# Patient Record
Sex: Female | Born: 1935 | Race: Black or African American | Hispanic: No | Smoking: Never smoker
Health system: Southern US, Community
[De-identification: ages and names within clinical notes are randomized; demographics above are authoritative.]

## PROBLEM LIST (undated history)

## (undated) DIAGNOSIS — I2510 Atherosclerotic heart disease of native coronary artery without angina pectoris: Secondary | ICD-10-CM

## (undated) DIAGNOSIS — I10 Essential (primary) hypertension: Secondary | ICD-10-CM

## (undated) DIAGNOSIS — F0390 Unspecified dementia without behavioral disturbance: Secondary | ICD-10-CM

## (undated) DIAGNOSIS — M069 Rheumatoid arthritis, unspecified: Secondary | ICD-10-CM

## (undated) HISTORY — PX: OTHER SURGICAL HISTORY: SHX169

---

## 1998-11-22 ENCOUNTER — Encounter: Payer: Self-pay | Admitting: Emergency Medicine

## 1998-11-22 ENCOUNTER — Emergency Department (HOSPITAL_COMMUNITY): Admission: EM | Admit: 1998-11-22 | Discharge: 1998-11-22 | Payer: Self-pay | Admitting: Emergency Medicine

## 2001-09-05 ENCOUNTER — Other Ambulatory Visit: Admission: RE | Admit: 2001-09-05 | Discharge: 2001-09-05 | Payer: Self-pay | Admitting: Obstetrics and Gynecology

## 2014-01-29 ENCOUNTER — Encounter (HOSPITAL_COMMUNITY): Payer: Self-pay | Admitting: Emergency Medicine

## 2014-01-29 ENCOUNTER — Emergency Department (HOSPITAL_COMMUNITY)
Admission: EM | Admit: 2014-01-29 | Discharge: 2014-01-29 | Disposition: A | Discharge status: Home / Self Care | Payer: Medicare Other | Attending: Emergency Medicine | Admitting: Emergency Medicine

## 2014-01-29 DIAGNOSIS — I10 Essential (primary) hypertension: Secondary | ICD-10-CM | POA: Insufficient documentation

## 2014-01-29 DIAGNOSIS — X500XXA Overexertion from strenuous movement or load, initial encounter: Secondary | ICD-10-CM | POA: Insufficient documentation

## 2014-01-29 DIAGNOSIS — Y9389 Activity, other specified: Secondary | ICD-10-CM | POA: Insufficient documentation

## 2014-01-29 DIAGNOSIS — S46819A Strain of other muscles, fascia and tendons at shoulder and upper arm level, unspecified arm, initial encounter: Secondary | ICD-10-CM

## 2014-01-29 DIAGNOSIS — S43499A Other sprain of unspecified shoulder joint, initial encounter: Secondary | ICD-10-CM | POA: Insufficient documentation

## 2014-01-29 DIAGNOSIS — Y929 Unspecified place or not applicable: Secondary | ICD-10-CM | POA: Insufficient documentation

## 2014-01-29 DIAGNOSIS — I2510 Atherosclerotic heart disease of native coronary artery without angina pectoris: Secondary | ICD-10-CM | POA: Insufficient documentation

## 2014-01-29 HISTORY — DX: Essential (primary) hypertension: I10

## 2014-01-29 HISTORY — DX: Atherosclerotic heart disease of native coronary artery without angina pectoris: I25.10

## 2014-01-29 MED ORDER — IBUPROFEN 400 MG PO TABS
400.0000 mg | ORAL_TABLET | Freq: Once | ORAL | Status: AC
  Administered 2014-01-29: 400 mg via ORAL
  Filled 2014-01-29: qty 1

## 2014-01-29 MED ORDER — IBUPROFEN 400 MG PO TABS: 400.0000 mg | ORAL_TABLET | Freq: Four times a day (QID) | ORAL | Status: DC | PRN

## 2014-01-29 NOTE — ED Notes (Signed)
C/o right neck pain which goes to right shoulder. "If feels like worms in my shoulder....something keeps moving". No deformity noted.
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
## 2014-01-29 NOTE — ED Provider Notes (Signed)
Medical screening examination/treatment/procedure(s) were performed by non-physician practitioner and as supervising physician I was immediately available for consultation/collaboration.     Adamae Ricklefs, MD 01/29/14 1533 

## 2014-01-29 NOTE — Progress Notes (Addendum)
01/29/2014 403 W. Aundria Rud RN BSN [phone] ED f/u Appt scheduled for 2/10 at 215p.  Notified patient of appt date and time. Pt verbalized understanding teach back method used . No further ED CM needs identified    2/2/ 2015 2:58 W. Aundria Rud RN BSN [phone] [facility] ED CM consulted to meet with patient regarding medication issue. Pt presented to [facility] ED with c/o right side neck pain.  CM in to speak with patient, Patient states, that she has not take her b/p medications in about 2 months, because it made her feel, like "something was crawling on her",  Asked patient about PCP who prescribes her medications. She reports that she goes to the [facility] and see Dayton Scrape NP. Offered to contact [facility] to schedule ED  f/u appt. Pt appreciative and agreeable with plan. Discussed the importance of utilizing the PCP, for all non-emergent medical needs. Reviewed and reinforced  emergency medical needs. Pt verbalized understanding teach back method used. Discussed with TNeva Seat PA-C in FT agrees with plan. [facility] ED CM will follow up with patient with schedule appt.

## 2014-01-29 NOTE — ED Notes (Addendum)
States  Rt sided neck pain states had a little boil has no idea where that went  Feel like something is ,moving in neck she states no injury

## 2014-01-29 NOTE — Discharge Instructions (Signed)

## 2014-08-07 ENCOUNTER — Ambulatory Visit: Payer: Medicare Other | Admitting: Cardiovascular Disease

## 2014-09-17 ENCOUNTER — Emergency Department (HOSPITAL_COMMUNITY): Payer: Medicare Other

## 2014-09-17 ENCOUNTER — Emergency Department (HOSPITAL_COMMUNITY)
Admission: EM | Admit: 2014-09-17 | Discharge: 2014-09-17 | Disposition: A | Discharge status: Home / Self Care | Payer: Medicare Other | Attending: Emergency Medicine | Admitting: Emergency Medicine

## 2014-09-17 ENCOUNTER — Encounter (HOSPITAL_COMMUNITY): Payer: Self-pay | Admitting: Emergency Medicine

## 2014-09-17 DIAGNOSIS — M79609 Pain in unspecified limb: Secondary | ICD-10-CM | POA: Diagnosis present

## 2014-09-17 DIAGNOSIS — I2510 Atherosclerotic heart disease of native coronary artery without angina pectoris: Secondary | ICD-10-CM | POA: Diagnosis not present

## 2014-09-17 DIAGNOSIS — I10 Essential (primary) hypertension: Secondary | ICD-10-CM | POA: Insufficient documentation

## 2014-09-17 DIAGNOSIS — M79641 Pain in right hand: Secondary | ICD-10-CM
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
## 2014-09-17 NOTE — Discharge Instructions (Signed)

## 2014-09-17 NOTE — ED Notes (Signed)
PA speaking with pt and updated on POC
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
## 2014-09-17 NOTE — Progress Notes (Signed)
Orthopedic Tech Progress Note Patient Details:  Emma [name] Apr 04, 1932 [id number] [employer] Type of Ortho Device: Velcro wrist splint Ortho Device/Splint Location: RUE Ortho Device/Splint Interventions: Application   [name] 09/17/2014, 2:05 PM

## 2014-09-18 NOTE — ED Provider Notes (Signed)
Medical screening examination/treatment/procedure(s) were performed by non-physician practitioner and as supervising physician I was immediately available for consultation/collaboration.     Suzi Roots, MD 09/18/14 1309

## 2014-10-17 ENCOUNTER — Ambulatory Visit: Payer: Medicare Other | Admitting: Cardiovascular Disease

## 2015-05-15 ENCOUNTER — Encounter (HOSPITAL_COMMUNITY): Payer: Self-pay | Admitting: Emergency Medicine

## 2015-05-15 ENCOUNTER — Emergency Department (INDEPENDENT_AMBULATORY_CARE_PROVIDER_SITE_OTHER)
Admission: EM | Admit: 2015-05-15 | Discharge: 2015-05-15 | Disposition: A | Discharge status: Home / Self Care | Payer: Medicare Other | Source: Home / Self Care | Attending: Family Medicine | Admitting: Family Medicine

## 2015-05-15 DIAGNOSIS — I10 Essential (primary) hypertension: Secondary | ICD-10-CM | POA: Diagnosis not present

## 2015-05-15 MED ORDER — LISINOPRIL-HYDROCHLOROTHIAZIDE 10-12.5 MG PO TABS: 1.0000 | ORAL_TABLET | Freq: Every day | ORAL | Status: DC

## 2015-05-15 NOTE — Discharge Instructions (Signed)

## 2015-05-15 NOTE — ED Provider Notes (Signed)
CSN: [id number]     Arrival date & time 05/15/15  1846 History   First MD Initiated Contact with Patient 05/15/15 1938     Chief Complaint  Patient presents with  . Hypertension   (Consider location/radiation/quality/duration/timing/severity/associated sxs/prior Treatment) HPI Comments: 81 year old female is brought in by the daughter with uncontrolled hypertension. Associated symptoms or current complaints. The patient states that she has her medicines at home but she just declines to take them. Her daughter states that if I will give her prescription for some actually that she takes it every day. There is probably some suspicion that the patient may have some dementia or forgetfulness.   Past Medical History  Diagnosis Date  . Hypertension   . Coronary artery disease    Past Surgical History  Procedure Laterality Date  . Stents     History reviewed. No pertinent family history. History  Substance Use Topics  . Smoking status: Never Smoker   . Smokeless tobacco: Not on file  . Alcohol Use: No   OB History    No data available     Review of Systems  Constitutional: Negative for fever, activity change and fatigue.  HENT: Negative.   Respiratory: Negative.  Negative for cough and shortness of breath.   Cardiovascular: Negative for chest pain, palpitations and leg swelling.  Gastrointestinal: Negative.   Neurological: Negative.   All other systems reviewed and are negative.   Allergies  Review of patient's allergies indicates no known allergies.  Home Medications   Prior to Admission medications   Medication Sig Start Date End Date Taking? Authorizing Provider  lisinopril (PRINIVIL,ZESTRIL) 10 MG tablet Take 10 mg by mouth daily.   Yes Historical Provider, MD  ibuprofen (ADVIL,MOTRIN) 400 MG tablet Take 1 tablet (400 mg total) by mouth every 6 (six) hours as needed. 01/29/14   Tiffany Neva Seat, PA-C  lisinopril-hydrochlorothiazide (PRINZIDE,ZESTORETIC) 10-12.5 MG per tablet  Take 1 tablet by mouth daily. 05/15/15   Hayden Rasmussen, NP   BP 199/111 mmHg  Pulse 70  Temp(Src) 98.9 F (37.2 C) (Oral)  Resp 18  SpO2 100% Physical Exam  Constitutional: She appears well-developed. No distress.  Eyes: EOM are normal.  Neck: Normal range of motion. Neck supple. No JVD present.  No carotid bruits. Bi Lateral carotid pulses 2+.  Cardiovascular: Normal rate, regular rhythm and normal heart sounds.   Pulmonary/Chest: Effort normal and breath sounds normal. No respiratory distress. She has no wheezes. She has no rales.  Abdominal: Soft. There is no tenderness.  Musculoskeletal: She exhibits no edema.  Lymphadenopathy:    She has no cervical adenopathy.  Neurological: She is alert. No cranial nerve deficit. She exhibits normal muscle tone.  Skin: Skin is warm and dry.  Nursing note and vitals reviewed.   ED Course  Procedures (including critical care time) Labs Review Labs Reviewed - No data to display  Imaging Review No results found.   MDM   1. Essential hypertension    Exam unremarkable. Blood pressure noted. Will write prescription for Zestoretic 10/12.5 mg to take one daily and given to the daughter so that she may administer to her mother. She is advised to take a blood pressure about 3 times a week now and follow-up with her doctor next week for blood pressure check and possible additional medication management.    Hayden Rasmussen, NP 05/15/15 2001

## 2015-05-15 NOTE — ED Notes (Signed)
Pt has high blood pressure pt has BP medication but chooses not to take it

## 2015-06-13 ENCOUNTER — Ambulatory Visit: Payer: Medicare Other | Admitting: Cardiology

## 2017-09-16 ENCOUNTER — Emergency Department (HOSPITAL_COMMUNITY)
Admission: EM | Admit: 2017-09-16 | Discharge: 2017-09-16 | Disposition: A | Discharge status: Home / Self Care | Payer: Medicare Other | Attending: Emergency Medicine | Admitting: Emergency Medicine

## 2017-09-16 ENCOUNTER — Encounter (HOSPITAL_COMMUNITY): Payer: Self-pay | Admitting: Emergency Medicine

## 2017-09-16 DIAGNOSIS — Z79899 Other long term (current) drug therapy: Secondary | ICD-10-CM | POA: Insufficient documentation

## 2017-09-16 DIAGNOSIS — I10 Essential (primary) hypertension: Secondary | ICD-10-CM | POA: Diagnosis not present

## 2017-09-16 DIAGNOSIS — M79604 Pain in right leg: Secondary | ICD-10-CM | POA: Diagnosis present

## 2017-09-16 DIAGNOSIS — M79605 Pain in left leg: Secondary | ICD-10-CM | POA: Diagnosis not present

## 2017-09-16 DIAGNOSIS — I2510 Atherosclerotic heart disease of native coronary artery without angina pectoris: Secondary | ICD-10-CM | POA: Diagnosis not present

## 2017-09-16 DIAGNOSIS — M791 Myalgia: Secondary | ICD-10-CM | POA: Diagnosis not present

## 2017-09-16 HISTORY — DX: Rheumatoid arthritis, unspecified: M06.9

## 2017-09-16 LAB — I-STAT CHEM 8, ED
BUN: 11 mg/dL (ref 6–20)
CALCIUM ION: 1.2 mmol/L (ref 1.15–1.40)
CHLORIDE: 105 mmol/L (ref 101–111)
Creatinine, Ser: 0.5 mg/dL (ref 0.44–1.00)
GLUCOSE: 114 mg/dL — AB (ref 65–99)
HCT: 39 % (ref 36.0–46.0)
HEMOGLOBIN: 13.3 g/dL (ref 12.0–15.0)
POTASSIUM: 3.9 mmol/L (ref 3.5–5.1)
SODIUM: 142 mmol/L (ref 135–145)
TCO2: 27 mmol/L (ref 22–32)

## 2017-09-16 MED ORDER — TRAMADOL HCL 50 MG PO TABS: 50.0000 mg | ORAL_TABLET | Freq: Four times a day (QID) | ORAL | 0 refills | Status: DC | PRN

## 2017-09-16 MED ORDER — ACETAMINOPHEN 500 MG PO TABS
1000.0000 mg | ORAL_TABLET | Freq: Once | ORAL | Status: AC
  Administered 2017-09-16: 1000 mg via ORAL
  Filled 2017-09-16: qty 2
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
## 2017-09-16 NOTE — ED Triage Notes (Signed)
Pt. reports chronic bilateral feet,knees and lower legs burning" sensation/feels "hot" with tingling for several weeks , denies injury/ambulatory .

## 2017-10-04 ENCOUNTER — Emergency Department (HOSPITAL_COMMUNITY)
Admission: EM | Admit: 2017-10-04 | Discharge: 2017-10-05 | Disposition: A | Discharge status: Home / Self Care | Payer: Medicare Other | Attending: Emergency Medicine | Admitting: Emergency Medicine

## 2017-10-04 ENCOUNTER — Encounter (HOSPITAL_COMMUNITY): Payer: Self-pay

## 2017-10-04 DIAGNOSIS — X58XXXA Exposure to other specified factors, initial encounter: Secondary | ICD-10-CM | POA: Insufficient documentation

## 2017-10-04 DIAGNOSIS — S29012A Strain of muscle and tendon of back wall of thorax, initial encounter: Secondary | ICD-10-CM | POA: Diagnosis not present

## 2017-10-04 DIAGNOSIS — Y999 Unspecified external cause status: Secondary | ICD-10-CM | POA: Insufficient documentation

## 2017-10-04 DIAGNOSIS — Y939 Activity, unspecified: Secondary | ICD-10-CM | POA: Diagnosis not present

## 2017-10-04 DIAGNOSIS — M79605 Pain in left leg: Secondary | ICD-10-CM | POA: Diagnosis present

## 2017-10-04 DIAGNOSIS — I10 Essential (primary) hypertension: Secondary | ICD-10-CM | POA: Insufficient documentation

## 2017-10-04 DIAGNOSIS — I2510 Atherosclerotic heart disease of native coronary artery without angina pectoris: Secondary | ICD-10-CM | POA: Diagnosis not present

## 2017-10-04 DIAGNOSIS — T148XXA Other injury of unspecified body region, initial encounter: Secondary | ICD-10-CM

## 2017-10-04 DIAGNOSIS — Y929 Unspecified place or not applicable: Secondary | ICD-10-CM | POA: Diagnosis not present

## 2017-10-04 DIAGNOSIS — R202 Paresthesia of skin: Secondary | ICD-10-CM

## 2017-10-04 DIAGNOSIS — Z79899 Other long term (current) drug therapy: Secondary | ICD-10-CM | POA: Insufficient documentation

## 2017-10-04 LAB — CBC
HCT: 37.6 % (ref 36.0–46.0)
Hemoglobin: 12.8 g/dL (ref 12.0–15.0)
MCH: 31.2 pg (ref 26.0–34.0)
MCHC: 34 g/dL (ref 30.0–36.0)
MCV: 91.7 fL (ref 78.0–100.0)
PLATELETS: 177 10*3/uL (ref 150–400)
RBC: 4.1 MIL/uL (ref 3.87–5.11)
RDW: 12.8 % (ref 11.5–15.5)
WBC: 6.2 10*3/uL (ref 4.0–10.5)

## 2017-10-04 LAB — BASIC METABOLIC PANEL
Anion gap: 8 (ref 5–15)
BUN: 8 mg/dL (ref 6–20)
CHLORIDE: 107 mmol/L (ref 101–111)
CO2: 24 mmol/L (ref 22–32)
CREATININE: 0.7 mg/dL (ref 0.44–1.00)
Calcium: 9.4 mg/dL (ref 8.9–10.3)
GFR calc Af Amer: >60 mL/min (ref >60)
GFR calc non Af Amer: >60 mL/min (ref >60)
Glucose, Bld: 111 mg/dL — ABNORMAL HIGH (ref 65–99)
Potassium: 3.6 mmol/L (ref 3.5–5.1)
Sodium: 139 mmol/L (ref 135–145)

## 2017-10-04 LAB — I-STAT TROPONIN, ED: Troponin i, poc: 0.01 ng/mL (ref 0.00–0.08)

## 2017-10-04 MED ORDER — ACETAMINOPHEN 500 MG PO TABS
1000.0000 mg | ORAL_TABLET | Freq: Once | ORAL | Status: AC
  Administered 2017-10-04: 1000 mg via ORAL
  Filled 2017-10-04: qty 2
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
## 2017-10-04 NOTE — ED Provider Notes (Signed)
[facility]-EMERGENCY DEPT Provider Note   CSN: [id number] Arrival date & time: 10/04/17  2019     History   Chief Complaint Chief Complaint  Patient presents with  . Shooting pain from legs to head    HPI Emma [name] is a 83 y.o. female.  HPI   83 year old female with a history of hypertension, CAD, rheumatoid arthritis who presents to the emergency department with "electrical shocks that, from my legs up through my buttocks, up my back and into my head." She reports that this pain ongoing for several months. Exacerbated with movement at times. At times it sporadic in nature. Last several seconds. Some resolving. She reports that she sits around at home quite a bit. Is able to get up and ambulate. Denies any lower extremity weakness, bladder or bowel incontinence. Recent fevers or infections. In triage she endorsed left-sided chest pain has been ongoing for 4 days however to me she denied any chest pain and states that the pain that she is feeling is this electrical shock to her back. No associated shortness of breath, nausea, vomiting, abdominal pain. No other alleviating or aggravating factors. No other physical complaints.  Past Medical History:  Diagnosis Date  . Coronary artery disease   . Hypertension   . RA (rheumatoid arthritis) (HCC)     There are no active problems to display for this patient.   Past Surgical History:  Procedure Laterality Date  . stents      OB History    No data available       Home Medications    Prior to Admission medications   Medication Sig Start Date End Date Taking? Authorizing Provider  cholecalciferol (VITAMIN D) 400 units TABS tablet Take 400 Units by mouth daily after lunch.   Yes [provider]  HONEY PO Take 5 mLs by mouth daily. TAKE WITH PEANUT BUTTER ALSO To help control blood pressure ( OF EACH)   Yes [provider]  ibuprofen (ADVIL,MOTRIN) 400 MG tablet Take 1 tablet (400 mg total) by mouth every 6 (six)  hours as needed. 01/29/14  Yes Neva Seat, Tiffany, PA-C  OVER THE COUNTER MEDICATION Take 2 capsules by mouth every morning. OVER THE COUNTER VITAMIN THAT HAS OYSTER/ CRAB/ LOBSTER IN IT   Yes [provider]  traMADol (ULTRAM) 50 MG tablet Take 1 tablet (50 mg total) by mouth every 6 (six) hours as needed. 09/16/17  Yes Rolan Bucco, MD  TRAVATAN Z 0.004 % SOLN ophthalmic solution Place 1 drop into both eyes at bedtime. 08/12/17  Yes [provider]  vitamin A 82956 UNIT capsule Take 10,000 Units by mouth every morning.   Yes [provider]  vitamin B-12 (CYANOCOBALAMIN) 100 MCG tablet Take 100 mcg by mouth every morning.   Yes [provider]  vitamin C (ASCORBIC ACID) 500 MG tablet Take 500 mg by mouth daily after lunch.   Yes [provider]  vitamin E 400 UNIT capsule Take 400 Units by mouth daily after lunch.   Yes [provider]  Apoaequorin (PREVAGEN PO) Take 1 capsule by mouth daily after lunch.    [provider]  lisinopril-hydrochlorothiazide (PRINZIDE,ZESTORETIC) 10-12.5 MG per tablet Take 1 tablet by mouth daily. Patient not taking: Reported on 10/04/2017 05/15/15   Hayden Rasmussen, NP    Family History No family history on file.  Social History Social History  Substance Use Topics  . Smoking status: Never Smoker  . Smokeless tobacco: Never Used  . Alcohol use  No     Allergies   Patient has no known allergies.   Review of Systems Review of Systems All other systems are reviewed and are negative for acute change except as noted in the HPI   Physical Exam Updated Vital Signs BP (!) 174/74   Pulse 65   Temp 98.7 F (37.1 C) (Oral)   Resp 12   Ht 5\' 10"  (1.778 m)   Wt 79.4 kg (175 lb)   SpO2 98%   BMI 25.11 kg/m   Physical Exam  Constitutional: She is oriented to person, place, and time. She appears well-developed and well-nourished. No distress.  HENT:  Head: Normocephalic and atraumatic.  Nose: Nose  normal.  Eyes: Pupils are equal, round, and reactive to light. Conjunctivae and EOM are normal. Right eye exhibits no discharge. Left eye exhibits no discharge. No scleral icterus.  Neck: Normal range of motion. Neck supple.  Cardiovascular: Normal rate and regular rhythm.  Exam reveals no gallop and no friction rub.   No murmur heard. Pulmonary/Chest: Effort normal and breath sounds normal. No stridor. No respiratory distress. She has no rales.  Abdominal: Soft. She exhibits no distension. There is no tenderness.  Musculoskeletal: She exhibits no edema.       Cervical back: She exhibits tenderness. She exhibits no bony tenderness.       Thoracic back: She exhibits tenderness. She exhibits no bony tenderness.       Back:  Neurological: She is alert and oriented to person, place, and time.  Spine Exam: Strength: 5/5 throughout LE bilaterally (hip flexion/extension, adduction/abduction; knee flexion/extension; foot dorsiflexion/plantarflexion, inversion/eversion; great toe inversion) Sensation: Intact to light touch in proximal and distal LE bilaterally Reflexes: 1+ quadriceps and achilles reflexes  Skin: Skin is warm and dry. No rash noted. She is not diaphoretic. No erythema.  Psychiatric: She has a normal mood and affect.  Vitals reviewed.    ED Treatments / Results  Labs (all labs ordered are listed, but only abnormal results are displayed) Labs Reviewed  BASIC METABOLIC PANEL - Abnormal; Notable for the following:       Result Value   Glucose, Bld 111 (*)    All other components within normal limits  CBC  I-STAT TROPONIN, ED    EKG  EKG Interpretation  Date/Time:  Monday October 04 2017 20:27:11 EDT Ventricular Rate:  70 PR Interval:    QRS Duration: 93 QT Interval:  419 QTC Calculation: 453 R Axis:   -22 Text Interpretation:  Sinus rhythm Borderline left axis deviation no stemi No old tracing to compare Confirmed by [id number] [phone]) on 10/04/2017 9:09:10 PM         Radiology No results found.  Procedures Procedures (including critical care time)  Medications Ordered in ED Medications  acetaminophen (TYLENOL) tablet 1,000 mg (1,000 mg Oral Given 10/04/17 2121)     Initial Impression / Assessment and Plan / ED Course  I have reviewed the triage vital signs and the nursing notes.  Pertinent labs & imaging results that were available during my care of the patient were reviewed by me and considered in my medical decision making (see chart for details).     EKG nonischemic and without evidence of pericarditis. Cardiac workup was initiated in triage and revealed negative troponin. Do not feel that additional cardiac workup is necessary at this time.  On exam patient has muscular tenderness of her upper mid back. Bending the patient exacerbates her symptomatology. Feel this is likely muscular in  nature. Red flags concerning for cauda equina syndrome. Low suspicion for GBS. No significant electrolyte derangements. Patient given Tylenol which provided significant improvement in her symptomatology.  The patient appears reasonably screened and/or stabilized for discharge and I doubt any other medical condition or other [facility] requiring further screening, evaluation, or treatment in the ED at this time prior to discharge.  The patient is safe for discharge with strict return precautions.   Final Clinical Impressions(s) / ED Diagnoses   Final diagnoses:  Paresthesia  Muscle strain   Disposition: Discharge  Condition: Good  I have discussed the results, Dx and Tx plan with the patient who expressed understanding and agree(s) with the plan. Discharge instructions discussed at great length. The patient was given strict return precautions who verbalized understanding of the instructions. No further questions at time of discharge.    New Prescriptions   No medications on file    Follow Up: Diamantina Providence, FNP [address] [place] [address] [phone]  Schedule an appointment as soon as possible for a visit  in 3-5 days      Ezelle Surprenant, Amadeo Garnet, MD 10/05/17 [phone]

## 2017-10-04 NOTE — ED Notes (Signed)
ED Provider at bedside. 

## 2017-10-04 NOTE — ED Notes (Signed)
Phlebotomy at bedside.

## 2017-10-14 DIAGNOSIS — I2510 Atherosclerotic heart disease of native coronary artery without angina pectoris: Secondary | ICD-10-CM | POA: Insufficient documentation

## 2017-10-14 DIAGNOSIS — R4182 Altered mental status, unspecified: Secondary | ICD-10-CM | POA: Diagnosis not present

## 2017-10-14 DIAGNOSIS — F22 Delusional disorders: Secondary | ICD-10-CM | POA: Insufficient documentation

## 2017-10-14 DIAGNOSIS — Z79899 Other long term (current) drug therapy: Secondary | ICD-10-CM | POA: Insufficient documentation

## 2017-10-14 DIAGNOSIS — I10 Essential (primary) hypertension: Secondary | ICD-10-CM | POA: Insufficient documentation

## 2017-10-14 DIAGNOSIS — M79606 Pain in leg, unspecified: Secondary | ICD-10-CM | POA: Diagnosis present
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
## 2017-10-15 ENCOUNTER — Encounter (HOSPITAL_COMMUNITY): Payer: Self-pay | Admitting: Registered Nurse

## 2017-10-15 ENCOUNTER — Emergency Department (HOSPITAL_COMMUNITY): Payer: Medicare Other

## 2017-10-15 ENCOUNTER — Emergency Department (HOSPITAL_COMMUNITY)
Admission: EM | Admit: 2017-10-15 | Discharge: 2017-10-18 | Disposition: A | Discharge status: Other Acute Inpatient Hospital | Payer: Medicare Other | Attending: Physician Assistant | Admitting: Physician Assistant

## 2017-10-15 DIAGNOSIS — F22 Delusional disorders: Secondary | ICD-10-CM

## 2017-10-15 LAB — TSH: TSH: 2.473 u[IU]/mL (ref 0.350–4.500)

## 2017-10-15 LAB — URINALYSIS, ROUTINE W REFLEX MICROSCOPIC
BILIRUBIN URINE: NEGATIVE
GLUCOSE, UA: NEGATIVE mg/dL
HGB URINE DIPSTICK: NEGATIVE
Ketones, ur: NEGATIVE mg/dL
Leukocytes, UA: NEGATIVE
Nitrite: NEGATIVE
PH: 5 (ref 5.0–8.0)
Protein, ur: NEGATIVE mg/dL
SPECIFIC GRAVITY, URINE: 1.006 (ref 1.005–1.030)

## 2017-10-15 LAB — RAPID URINE DRUG SCREEN, HOSP PERFORMED
AMPHETAMINES: NOT DETECTED
BARBITURATES: NOT DETECTED
Benzodiazepines: NOT DETECTED
Cocaine: NOT DETECTED
OPIATES: NOT DETECTED
TETRAHYDROCANNABINOL: NOT DETECTED

## 2017-10-15 LAB — I-STAT CHEM 8, ED
BUN: 7 mg/dL (ref 6–20)
CALCIUM ION: 1.12 mmol/L — AB (ref 1.15–1.40)
CHLORIDE: 107 mmol/L (ref 101–111)
CREATININE: 0.5 mg/dL (ref 0.44–1.00)
GLUCOSE: 101 mg/dL — AB (ref 65–99)
HCT: 41 % (ref 36.0–46.0)
HEMOGLOBIN: 13.9 g/dL (ref 12.0–15.0)
POTASSIUM: 3.5 mmol/L (ref 3.5–5.1)
Sodium: 141 mmol/L (ref 135–145)
TCO2: 22 mmol/L (ref 22–32)

## 2017-10-15 LAB — CBC
HCT: 41.3 % (ref 36.0–46.0)
Hemoglobin: 13.9 g/dL (ref 12.0–15.0)
MCH: 31.2 pg (ref 26.0–34.0)
MCHC: 33.7 g/dL (ref 30.0–36.0)
MCV: 92.8 fL (ref 78.0–100.0)
Platelets: 182 10*3/uL (ref 150–400)
RBC: 4.45 MIL/uL (ref 3.87–5.11)
RDW: 12.9 % (ref 11.5–15.5)
WBC: 5.2 10*3/uL (ref 4.0–10.5)

## 2017-10-15 MED ORDER — HYDROCHLOROTHIAZIDE 12.5 MG PO CAPS
12.5000 mg | ORAL_CAPSULE | Freq: Once | ORAL | Status: AC
  Administered 2017-10-15: 12.5 mg via ORAL
  Filled 2017-10-15: qty 1

## 2017-10-15 MED ORDER — TRAMADOL HCL 50 MG PO TABS
50.0000 mg | ORAL_TABLET | Freq: Four times a day (QID) | ORAL | Status: DC | PRN
  Administered 2017-10-17: 50 mg via ORAL
  Filled 2017-10-15: qty 1

## 2017-10-15 MED ORDER — ACETAMINOPHEN 325 MG PO TABS
650.0000 mg | ORAL_TABLET | ORAL | Status: DC | PRN
  Administered 2017-10-15 – 2017-10-17 (×2): 650 mg via ORAL

## 2017-10-15 MED ORDER — ACETAMINOPHEN 325 MG PO TABS
650.0000 mg | ORAL_TABLET | ORAL | Status: DC | PRN
  Filled 2017-10-15 (×2): qty 2

## 2017-10-15 MED ORDER — IBUPROFEN 400 MG PO TABS
400.0000 mg | ORAL_TABLET | Freq: Four times a day (QID) | ORAL | Status: DC | PRN
Start: 2017-10-15 — End: 2017-10-18

## 2017-10-15 MED ORDER — LISINOPRIL 10 MG PO TABS
10.0000 mg | ORAL_TABLET | Freq: Once | ORAL | Status: AC
  Administered 2017-10-15: 10 mg via ORAL
  Filled 2017-10-15: qty 1

## 2017-10-15 MED ORDER — LATANOPROST 0.005 % OP SOLN
1.0000 [drp] | Freq: Every day | OPHTHALMIC | Status: DC
  Administered 2017-10-15 – 2017-10-17 (×3): 1 [drp] via OPHTHALMIC
  Filled 2017-10-15: qty 2.5

## 2017-10-15 MED ORDER — LISINOPRIL-HYDROCHLOROTHIAZIDE 10-12.5 MG PO TABS: 1.0000 | ORAL_TABLET | Freq: Every day | ORAL | Status: DC

## 2017-10-15 NOTE — BH Assessment (Signed)
[facility] Assessment Progress Note  Requested labs completed. Nothing medically concerning noted. No UTI. Pt is recommended for IP treatment, per Assunta Found, NP.   Johny Shock. Ladona Ridgel, MS, NCC, LPCA Counselor

## 2017-10-15 NOTE — ED Notes (Signed)
Pt being moved to psych hold

## 2017-10-15 NOTE — Progress Notes (Signed)
Patient meets criteria for inpatient treatment. CSW faxed referrals to the following inpatient facilities for review:  [place], [place], [place], [address], [address], [place], [place], [place], [place], [facility] [facility]   TTS will continue to seek bed placement.   Baldo Daub MSW, LCSWA CSW Disposition [phone]
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
## 2017-10-15 NOTE — ED Notes (Signed)
Pt eating breakfast now.

## 2017-10-15 NOTE — ED Notes (Signed)
tts requesting UA and TSH, edp made aware. Pt just urinated and will re attempt urine sample.

## 2017-10-15 NOTE — ED Notes (Signed)
This RN received a call from the patient's daughter-in-law and son asking about the plan.  This RN informed them that as far as we knew, she is being observed overnight and re-evaluated in the morning because of her confusion.

## 2017-10-15 NOTE — ED Notes (Signed)
Delay in lab draw,  edp at bedside. 
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
## 2017-10-16 DIAGNOSIS — F22 Delusional disorders: Secondary | ICD-10-CM | POA: Insufficient documentation

## 2017-10-16 DIAGNOSIS — R413 Other amnesia: Secondary | ICD-10-CM

## 2017-10-16 DIAGNOSIS — F419 Anxiety disorder, unspecified: Secondary | ICD-10-CM | POA: Diagnosis not present

## 2017-10-16 DIAGNOSIS — R450 Nervousness: Secondary | ICD-10-CM

## 2017-10-16 LAB — I-STAT TROPONIN, ED: Troponin i, poc: 0 ng/mL (ref 0.00–0.08)

## 2017-10-16 MED ORDER — LISINOPRIL 10 MG PO TABS
10.0000 mg | ORAL_TABLET | Freq: Every day | ORAL | Status: DC
  Administered 2017-10-16 – 2017-10-18 (×3): 10 mg via ORAL
  Filled 2017-10-16 (×3): qty 1

## 2017-10-16 MED ORDER — HYDROCHLOROTHIAZIDE 12.5 MG PO CAPS
12.5000 mg | ORAL_CAPSULE | Freq: Every day | ORAL | Status: DC
  Administered 2017-10-16 – 2017-10-18 (×3): 12.5 mg via ORAL
  Filled 2017-10-16 (×3): qty 1

## 2017-10-16 MED ORDER — LISINOPRIL-HYDROCHLOROTHIAZIDE 10-12.5 MG PO TABS: 1.0000 | ORAL_TABLET | Freq: Every day | ORAL | Status: DC

## 2017-10-16 NOTE — ED Notes (Addendum)
Pt noted to have chicken in [employer] box on bedside table - Pt was attempting to eat the chicken. States was brought in by family member last pm. Encouraged pt to give to nurse so may thrown away. Pt gave to RN. Pt also noted to be wearing hospital gown. Pt's belongings in 1 labeled belongings bag w/her black shoes. Pt allowed RN to remove from - labeled and placed at nurses' desk so may be inventoried. Fall Risk bracelet applied to pt's wrist and Fall Risk magnet placed on doorway.

## 2017-10-16 NOTE — ED Notes (Signed)
EDP notified. 

## 2017-10-16 NOTE — ED Notes (Signed)
Lunch tray delivered.

## 2017-10-16 NOTE — ED Notes (Addendum)
Pt c/o left chest pain radiating down to her L leg.

## 2017-10-16 NOTE — BHH Counselor (Signed)
Reassessment note TTS: Pt was cooperative and calm during reassessment. She denies SI, HI or AVH. She states that she doesn't like her living situation and wants to move because people come into her room and take things from her. Pt did not mention anything bizarre or endorse any delusions at this time. Pt denies any psychiatric history but does endorse some depression due to not liking her living situation.   Collateral from son: Son states that he feels that her assisted living ([facility]) may not have enough supervision for her as she seems to be "mentally declining". He states that his sister brought her to [facility] to be evaluated in 2015 and they mentioned she could be developing dementia. Pt has no history of psychiatric issues noted by son.    [address] [place], LCAS

## 2017-10-16 NOTE — ED Notes (Signed)
Pt on phone at nurses' desk. 

## 2017-10-16 NOTE — ED Notes (Signed)
Son, Chrissie Noa, aware of tx plan - will have Telepsych performed.

## 2017-10-16 NOTE — ED Notes (Signed)
Sitting on side of bed talking w/staff.

## 2017-10-16 NOTE — Consult Note (Signed)
Telepsych Consultation   Reason for Consult: Paranoia   Referring Physician: Derwood Kaplan, MD Location of Patient: [facility] ED Location of Provider: [facility]  Patient Identification: Emma [name] MRN:  [id number] Principal Diagnosis: <principal problem not specified> Diagnosis:   Patient Active Problem List   Diagnosis Date Noted  . Paranoid (HCC) [F22]     Total Time spent with patient: 30 minutes  Subjective:   Emma [name] is a 83 y.o. female patient admitted with paranoid ideation.  HPI: Per the TTS assessment completed on 10/15/2017 by Marcelle Smiling: Emma [name] is an 83 y.o. female who presents to ED voluntarily with delusional thinking. Pt has no documented psychiatric hx, although she has been seen by [facility] for cognitive impairment since 2015. Pt also has no documented psych medications. Pt presents as calm, cooperative and pleasant. Pt believes that a group of women in her apartment "unit" are trying to "sexualize" people, including her. Pt reports that, if they are not allowed to "sexualize" her, they "use electricity and paralyze you". Pt says she receives electric shocks up her legs to her buttocks. Pt also shares that she has wrapped socks together with towels and placed them between her legs to prevent them from coming to her, but they still do. Pt indicates that more reports will be coming in concerning this and that she may be the first one. Pt doesn't appear fearful, but indicates that she does not want to go back to her apartment and is requesting help to "find another apartment to move into". Regarding family, pt reports having a child in [place], a child that works for the [employer], and a daughter (whom she specified as Merl Ancelet) who works at [employer] on the 3rd floor.  On Exam: Patient was seen via tele-psych, chart reviewed with treatment team. Patient in bed, awake, alert and oriented x3, disoriented to situation. Patient  exhibting paranoid ideations and continue to believe people are coming into her apartment and shocking her with electric. Patient denies any SI but said she is only HI towards the people that want to come and hurt her. Patient appears to be mildly confused.  Past Psychiatric History: As in H&P  Risk to Self: Suicidal Ideation: No Suicidal Intent: No Is patient at risk for suicide?: No Suicidal Plan?: No Access to Means: No Intentional Self Injurious Behavior: None Risk to Others: Homicidal Ideation: No Thoughts of Harm to Others: No Current Homicidal Intent: No Current Homicidal Plan: No Access to Homicidal Means: No History of harm to others?: No Assessment of Violence: None Noted Does patient have access to weapons?: No Criminal Charges Pending?: No Does patient have a court date: No Prior Inpatient Therapy: Prior Inpatient Therapy: No Prior Outpatient Therapy: Prior Outpatient Therapy: Yes Prior Therapy Dates: unclear Does patient have an ACCT team?: No Does patient have Intensive In-House Services?  : No Does patient have [facility] services? : No Does patient have P4CC services?: No  Past Medical History:  Past Medical History:  Diagnosis Date  . Coronary artery disease   . Hypertension   . RA (rheumatoid arthritis) (HCC)     Past Surgical History:  Procedure Laterality Date  . stents     Family History: History reviewed. No pertinent family history. Family Psychiatric  History: Unknown  Social History:  History  Alcohol Use No     History  Drug use: Unknown    Social History   Social History  . Marital status: Widowed  Spouse name: N/A  . Number of children: N/A  . Years of education: N/A   Social History Main Topics  . Smoking status: Never Smoker  . Smokeless tobacco: Never Used  . Alcohol use No  . Drug use: Unknown  . Sexual activity: Not Asked   Other Topics Concern  . None   Social History Narrative  . None   Additional Social  History:    Allergies:  No Known Allergies  Labs:  Results for orders placed or performed during the hospital encounter of 10/15/17 (from the past 48 hour(s))  CBC     Status: None   Collection Time: 10/15/17  8:15 AM  Result Value Ref Range   WBC 5.2 4.0 - 10.5 K/uL   RBC 4.45 3.87 - 5.11 MIL/uL   Hemoglobin 13.9 12.0 - 15.0 g/dL   HCT 27.0 35.0 - 09.3 %   MCV 92.8 78.0 - 100.0 fL   MCH 31.2 26.0 - 34.0 pg   MCHC 33.7 30.0 - 36.0 g/dL   RDW 81.8 29.9 - 37.1 %   Platelets 182 150 - 400 K/uL  I-stat chem 8, ed     Status: Abnormal   Collection Time: 10/15/17  8:30 AM  Result Value Ref Range   Sodium 141 135 - 145 mmol/L   Potassium 3.5 3.5 - 5.1 mmol/L   Chloride 107 101 - 111 mmol/L   BUN 7 6 - 20 mg/dL   Creatinine, Ser 6.96 0.44 - 1.00 mg/dL   Glucose, Bld 789 (H) 65 - 99 mg/dL   Calcium, Ion 3.81 (L) 1.15 - 1.40 mmol/L   TCO2 22 22 - 32 mmol/L   Hemoglobin 13.9 12.0 - 15.0 g/dL   HCT 01.7 51.0 - 25.8 %  TSH     Status: None   Collection Time: 10/15/17  2:49 PM  Result Value Ref Range   TSH 2.473 0.350 - 4.500 uIU/mL    Comment: Performed by a 3rd Generation assay with a functional sensitivity of <=0.01 uIU/mL.  Urinalysis, Routine w reflex microscopic     Status: Abnormal   Collection Time: 10/15/17  4:07 PM  Result Value Ref Range   Color, Urine STRAW (A) YELLOW   APPearance CLEAR CLEAR   Specific Gravity, Urine 1.006 1.005 - 1.030   pH 5.0 5.0 - 8.0   Glucose, UA NEGATIVE NEGATIVE mg/dL   Hgb urine dipstick NEGATIVE NEGATIVE   Bilirubin Urine NEGATIVE NEGATIVE   Ketones, ur NEGATIVE NEGATIVE mg/dL   Protein, ur NEGATIVE NEGATIVE mg/dL   Nitrite NEGATIVE NEGATIVE   Leukocytes, UA NEGATIVE NEGATIVE  Rapid urine drug screen (hospital performed)     Status: None   Collection Time: 10/15/17  4:07 PM  Result Value Ref Range   Opiates NONE DETECTED NONE DETECTED   Cocaine NONE DETECTED NONE DETECTED   Benzodiazepines NONE DETECTED NONE DETECTED   Amphetamines  NONE DETECTED NONE DETECTED   Tetrahydrocannabinol NONE DETECTED NONE DETECTED   Barbiturates NONE DETECTED NONE DETECTED    Comment:        DRUG SCREEN FOR MEDICAL PURPOSES ONLY.  IF CONFIRMATION IS NEEDED FOR ANY PURPOSE, NOTIFY LAB WITHIN 5 DAYS.        LOWEST DETECTABLE LIMITS FOR URINE DRUG SCREEN Drug Class       Cutoff (ng/mL) Amphetamine      1000 Barbiturate      200 Benzodiazepine   200 Tricyclics       300 Opiates  300 Cocaine          300 THC              50   I-Stat Troponin, ED (not at [facility])     Status: None   Collection Time: 10/16/17  2:49 AM  Result Value Ref Range   Troponin i, poc 0.00 0.00 - 0.08 ng/mL   Comment 3            Comment: Due to the release kinetics of cTnI, a negative result within the first hours of the onset of symptoms does not rule out myocardial infarction with certainty. If myocardial infarction is still suspected, repeat the test at appropriate intervals.     Medications:  Current Facility-Administered Medications  Medication Dose Route Frequency Provider Last Rate Last Dose  . acetaminophen (TYLENOL) tablet 650 mg  650 mg Oral Q4H PRN Rhunette Croft, Ankit, MD      . acetaminophen (TYLENOL) tablet 650 mg  650 mg Oral Q4H PRN Rolland Porter, MD   650 mg at 10/15/17 2306  . lisinopril (PRINIVIL,ZESTRIL) tablet 10 mg  10 mg Oral Daily Rolan Bucco, MD       And  . hydrochlorothiazide (MICROZIDE) capsule 12.5 mg  12.5 mg Oral Daily Belfi, Melanie, MD      . ibuprofen (ADVIL,MOTRIN) tablet 400 mg  400 mg Oral Q6H PRN Nanavati, Ankit, MD      . latanoprost (XALATAN) 0.005 % ophthalmic solution 1 drop  1 drop Both Eyes QHS Rolland Porter, MD   1 drop at 10/15/17 2307  . traMADol (ULTRAM) tablet 50 mg  50 mg Oral Q6H PRN Derwood Kaplan, MD       Current Outpatient Prescriptions  Medication Sig Dispense Refill  . Apoaequorin (PREVAGEN PO) Take 1 capsule by mouth daily after lunch.    . cholecalciferol (VITAMIN D) 400 units TABS tablet  Take 400 Units by mouth daily after lunch.    . HONEY PO Take 5 mLs by mouth daily. TAKE WITH PEANUT BUTTER ALSO To help control blood pressure ( OF EACH)    . ibuprofen (ADVIL,MOTRIN) 400 MG tablet Take 1 tablet (400 mg total) by mouth every 6 (six) hours as needed. 30 tablet 0  . OVER THE COUNTER MEDICATION Take 2 capsules by mouth every morning. OVER THE COUNTER VITAMIN THAT HAS OYSTER/ CRAB/ LOBSTER IN IT    . traMADol (ULTRAM) 50 MG tablet Take 1 tablet (50 mg total) by mouth every 6 (six) hours as needed. 15 tablet 0  . TRAVATAN Z 0.004 % SOLN ophthalmic solution Place 1 drop into both eyes at bedtime.  3  . vitamin A 86381 UNIT capsule Take 10,000 Units by mouth every morning.    . vitamin B-12 (CYANOCOBALAMIN) 100 MCG tablet Take 100 mcg by mouth every morning.    . vitamin C (ASCORBIC ACID) 500 MG tablet Take 500 mg by mouth daily after lunch.    . vitamin E 400 UNIT capsule Take 400 Units by mouth daily after lunch.    . lisinopril-hydrochlorothiazide (PRINZIDE,ZESTORETIC) 10-12.5 MG per tablet Take 1 tablet by mouth daily. (Patient not taking: Reported on 10/04/2017) 30 tablet 0    Musculoskeletal: UTA via camera  Psychiatric Specialty Exam: Physical Exam  Nursing note and vitals reviewed.   Review of Systems  Psychiatric/Behavioral: Positive for memory loss. Negative for depression, hallucinations, substance abuse and suicidal ideas. The patient is nervous/anxious. The patient does not have insomnia.     Blood pressure (!) 148/73,  pulse (!) 118, temperature 97.8 F (36.6 C), temperature source Oral, resp. rate 20, height 5\' 10"  (1.778 m), weight 79.4 kg (175 lb), SpO2 92 %.Body mass index is 25.11 kg/m.  General Appearance: on hospital scrub  Eye Contact:  Fair  Speech:  Pressured  Volume:  Normal  Mood:  Anxious  Affect:  Appropriate  Thought Process:  Descriptions of Associations: Circumstantial  Orientation:  Full (Time, Place, and Person)  Thought Content:   Paranoid Ideation  Suicidal Thoughts:  No  Homicidal Thoughts:  No  Memory:  Immediate;   Fair Recent;   Fair Remote;   Poor  Judgement:  Fair  Insight:  Fair  Psychomotor Activity:  Normal  Concentration:  Concentration: Fair and Attention Span: Fair  Recall:  Poor  Fund of Knowledge:  Good  Language:  Good  Akathisia:  Negative  Handed:  Right  AIMS (if indicated):     Assets:  Desire for Improvement Financial Resources/Insurance Housing Physical Health Resilience  ADL's:  Intact  Cognition:  WNL  Sleep:       Treatment Plan Summary: Daily contact with patient to assess and evaluate symptoms and progress in treatment, Medication management and Plan to admit to inpatient hospitalization  Disposition: Recommend psychiatric Inpatient admission when medically cleared.  This service was provided via telemedicine using a 2-way, interactive audio and video technology.  Names of all persons participating in this telemedicine service and their role in this encounter. Name: Lakeyshia Tuckerman Role: Patient  Name: Cassi Jenne A. Darrol Jump Role: Provider          Beryle Lathe, NP 10/16/2017 10:21 AM

## 2017-10-16 NOTE — ED Notes (Signed)
Pt on phone at nurses' desk - voiced understanding last phone for the day.

## 2017-10-16 NOTE — ED Notes (Signed)
Telepsych being performed. 

## 2017-10-16 NOTE — ED Notes (Signed)
Pt's son visiting w/pt.

## 2017-10-16 NOTE — Progress Notes (Addendum)
LCSW following for disposition:  Inpatient referral  Followed up with the following facilities:  [name]: message left [facility]: pending, still under review,  1:31 PM did not have record of referral, resent for review. [facility]: Declined due to Dementia Dx primary. [facility]: on wait list  Will continue to seek inpatient placement and referrals. Will discuss with [facility] treatment team for possibly re-assessment with NP.  Deretha Emory, MSW Clinical Social Work: Optician, dispensing Coverage for :  [phone]

## 2017-10-17 NOTE — ED Notes (Signed)
Visitor has left. Pt sitting on bed watching tv.

## 2017-10-17 NOTE — ED Notes (Signed)
Visitors x 2 w/pt.

## 2017-10-17 NOTE — ED Notes (Signed)
Female visitor visiting w/pt.

## 2017-10-17 NOTE — BHH Counselor (Signed)
Per the TTS assessment completed on 10/15/2017 by Marcelle Smiling: Emma [name] an 83 y.o.femalewho presents to ED voluntarily with delusional thinking. Pt has no documented psychiatric hx, although she has been seen by [facility] for cognitive impairment since 2015. Pt also has no documented psych medications. Pt presents as calm, cooperative and pleasant. Pt believes that a group of women in her apartment "unit" are trying to "sexualize" people, including her. Pt reports that, if they are not allowed to "sexualize" her, they "use electricity and paralyze you". Pt says she receives electric shocks up her legs to her buttocks. Pt also shares that she has wrapped socks together with towels and placed them between her legs to prevent them from coming to her, but they still do. Pt indicates that more reports will be coming in concerning this and that she may be the first one. Pt doesn't appear fearful, but indicates that she does not want to go back to her apartment and is requesting help to "find another apartment to move into". Regarding family, pt reports having a child in [place], a child that works for the [employer], and a daughter (whom she specified as Emma [name]) who works at [employer] on the 3rd floor.  Pt was reassessed on 10/17/17.  She was sitting upright, eating breakfast.  Pt reported that she feels OK.  When asked about what brought her to the hospital, Pt indicated that she has pain in her legs.  She also stated that people in her apartment complex bother her.  ''I know people don't believe it, but I get electric shocks."    Recommend continued inpatient.

## 2017-10-17 NOTE — ED Notes (Signed)
Pt ambulatory to nurses' desk attempted to make phone call - left message.

## 2017-10-17 NOTE — BHH Counselor (Signed)
Pt tentatively accepted to [facility]  However, they requested IVC paperwork, and Pt is not under IVC.  [facility] is now requesting HPOA.  They will hold bed open until tomorrow.

## 2017-10-17 NOTE — ED Notes (Signed)
Pt ambulatory to nurses' desk, spoke w/staff, and then returned to room. Pt calm, cooperative.

## 2017-10-17 NOTE — Progress Notes (Addendum)
[name] Emma [name], intake) advises facility can accept pt for treatment. However, pt would either have to be under IVC for admission or have a healthcare power of attorney consent to admission. Advise they will not be able to have pt sign herself in voluntarily as this is safety liability as there is documentation pt has dementia and experiences delusions- would question her ability to understand and consent for treatment.  Left voicemail for Emma [name] [phone] - will inquire as to there being a healthcare power of attorney for pt.  Will follow up with Emma [name].  [name], MSW, LCSW Clinical Social Work 10/17/2017 Coverage for [phone]  14:40-  left voicemail again for Emma [name] at number above and for Emma [name] at [phone].   Em[facility] behavioral [name] (Emma [name]) called to state pt has been accepted to [facility] also. States that if pt has HPOA or legal guardian, that individual would be needed to accompany pt at admission to sign her in. States if pt is not under IVC and is her own POA/guardian and is willing to sign in voluntarily and demonstrates capacity to do so, they would accept pt voluntarily.   Will follow up with [name] and [facility] once hearing back from Emma [name] or [name] re: HPOA status.

## 2017-10-17 NOTE — ED Notes (Signed)
Pt given bath in room by sitter.

## 2017-10-17 NOTE — BHH Counselor (Signed)
Attempted to reach daughter -- left voicemail for her at [phone] re: healthcare power of attorney.  [facility] has tentatively accepted Pt to their facility pending IVC or HPOA.  Pt is not under IVC.

## 2017-10-17 NOTE — ED Notes (Signed)
[facility] advised pt has visitors - advised to ask them to return at visitation time.

## 2017-10-17 NOTE — ED Notes (Signed)
Meal tray in room, pt eating. No concerns or complaints. 

## 2017-10-17 NOTE — ED Notes (Signed)
Visitors left.

## 2017-10-17 NOTE — Progress Notes (Addendum)
Followed up on inpatient referral efforts. Pt recommended to be in need of inpatient geriatric treatment since 10/15/17 TTS assesssments/reassessments.  [facility]- behavioral admissions called to advise they are reviewing pt's referral today for admission and will call back with outcome.  [facility]- on waiting list  Declined: [facility]- due to no inpatient psychiatric criteria, dementia behaviors only per Tinnie Gens, admitting MD [facility]- due to dementia dx as primary  Reassessment may be beneficial due to now multiple declinations due to finding dementia as primary.   [name], MSW, LCSW Clinical Social Work 10/17/2017 Coverage for [phone]

## 2017-10-18 NOTE — Progress Notes (Signed)
Disposition CSW and TTS counselors have been unsuccessful in reaching the patient's daughter/Healthcare POA, Elfrieda Espino [phone]) to discuss the patient's acceptance to [facility].   CSW notified the patient's ED Provider, Dr. Laverna Peace.  Dr. Laverna Peace reports she will initiate IVC paperwork so that the patient can be transported to [facility] facility today for inpatient gero-psych treatment.   CSW will continue follow and will fax information to [facility] once complete.  [name] fax number is [phone] or [phone].    Baldo Daub MSW, LCSWA CSW Disposition [phone]

## 2017-10-18 NOTE — BHH Counselor (Signed)
Attempted to reach patient's daughter @ 9:40am -- left voicemail for her at [phone] requesting daughter return call. Voice-mail general within HIPPA compliance with a call back number provided.

## 2017-10-18 NOTE — ED Provider Notes (Signed)
Patient was seen by [facility]  and thought to meet inpatient requirements. However there unable to touch with her daughter who is the power of attorney. They're requesting me to IVC patient. When discussed with patient and she does appear to have current delusions that are hyperreligious as well as delusions about someone killing her. I think it is appropriate to IVC patient so that she is able to get the health care that she needs at the inpatient  psychiatric facility.  IVC paperwork completed  1:07 PM Jakorey Mcconathy L Jerelene Redden, Cindee Salt, MD 10/18/17 [phone]

## 2017-10-18 NOTE — Progress Notes (Signed)
CSW received the patient's IVC paperwork. CSW faxed the patient's IVC paperwork to [facility] at [phone].   CSW notified Melissa in admissions at [facility]. Per Efraim Kaufmann, she will call CSW back with accepting information, once the IVC paperwork is received.   Will continue to follow.   Baldo Daub MSW, LCSWA CSW Disposition [phone]

## 2017-10-18 NOTE — Progress Notes (Signed)
Per Efraim Kaufmann, the patient has been accepted to [facility] for inpatient treatment.   Accepting and attending physician is Dr. Seth Bake.  Number for report is [phone].  Patient can arrive at [facility] at anytime, the bed is ready.    Celine Ahr, RN and Dr. Corlis Leak, EDP notified.   Baldo Daub MSW, LCSWA CSW Disposition [phone]

## 2017-10-18 NOTE — ED Triage Notes (Signed)
IVC paper work sent to [facility]

## 2017-10-18 NOTE — ED Notes (Signed)
Declined W/C at D/C and was escorted to lobby by RN. 

## 2017-10-18 NOTE — ED Notes (Signed)
Patient is resting with call bell in reach  

## 2017-10-18 NOTE — ED Notes (Signed)
Pt woke up stating she "feel pain from in between here (pointing to groin area) around my thigh to my butt. That bed is not comfortable, I can't lay there any longer. I also have this feeling like something is moving in my head. I think I'm going to die." When asked to describe the pain, pt sts "I just feel funny is all." Pt asked if she would like some pain medicine and she sts "I just need to empty my bladder and I'll see." Pt ambulated to br and then back to bad with no other c/o. Reevaluated pt who now appears to be sleeping.

## 2017-10-18 NOTE — Progress Notes (Signed)
The patient has been accepted to [facility] for geriatric psychiatric inpatient treatment. [name] staff is requesting that the patient be IVC'd or signed in by a Healthcare POA. CSW received report that the patient's daughter, Alayjah Boehringer ([phone]) is her Healthcare POA.   CSW attempted to contact the patient's daughter/[facility] POA to see if it would be possible for her to sign her mother into [facility] for treatment today. There was no answer, CSW left a HIPAA compliant voicemail. There has been multiple attempts by TTS to contact the patient's daughter.   Disposition CSW notified the patient's ED Provider, Dr.Mackuen  CSW requesting that the patient be IVC'd in order for her to transport to [facility].   Per Dr. Corlis Leak, she will initiate the IVC paperwork if the patient's daughter can not be reached within the next hour. CSW will continue to follow.   [name] Intake Coordinator, Efraim Kaufmann [phone]) is notified.  Celine Ahr, RN notified.   Baldo Daub MSW, LCSWA CSW Disposition [phone]

## 2017-10-18 NOTE — BHH Counselor (Signed)
Re-assessment:   Patient report she feels like a brand new lady. Patient denies SI, HI, and AVH. Patient got up out of her chair and 'shouted/dance' for Psychiatric nurse. Patient talking spiritual to TTS giving her 'God' praise for making her feel better.   Patient discussing leaving the hospital to attend an appointment (she could not remember place) expressed she has an appointment appointment.

## 2017-11-15 ENCOUNTER — Emergency Department (HOSPITAL_COMMUNITY): Payer: Medicare Other

## 2017-11-15 ENCOUNTER — Encounter (HOSPITAL_COMMUNITY): Payer: Self-pay | Admitting: Emergency Medicine

## 2017-11-15 DIAGNOSIS — R4182 Altered mental status, unspecified: Secondary | ICD-10-CM | POA: Diagnosis not present

## 2017-11-15 DIAGNOSIS — Z79899 Other long term (current) drug therapy: Secondary | ICD-10-CM | POA: Diagnosis not present

## 2017-11-15 DIAGNOSIS — I2510 Atherosclerotic heart disease of native coronary artery without angina pectoris: Secondary | ICD-10-CM | POA: Diagnosis not present

## 2017-11-15 DIAGNOSIS — I10 Essential (primary) hypertension: Secondary | ICD-10-CM | POA: Diagnosis not present

## 2017-11-15 DIAGNOSIS — R079 Chest pain, unspecified: Secondary | ICD-10-CM | POA: Insufficient documentation

## 2017-11-15 LAB — BASIC METABOLIC PANEL
ANION GAP: 9 (ref 5–15)
BUN: 12 mg/dL (ref 6–20)
CO2: 22 mmol/L (ref 22–32)
Calcium: 9.3 mg/dL (ref 8.9–10.3)
Chloride: 103 mmol/L (ref 101–111)
Creatinine, Ser: 0.88 mg/dL (ref 0.44–1.00)
GFR calc Af Amer: >60 mL/min (ref >60)
GFR, EST NON AFRICAN AMERICAN: 59 mL/min — AB (ref >60)
GLUCOSE: 122 mg/dL — AB (ref 65–99)
POTASSIUM: 3.3 mmol/L — AB (ref 3.5–5.1)
Sodium: 134 mmol/L — ABNORMAL LOW (ref 135–145)

## 2017-11-15 LAB — CBC
HEMATOCRIT: 37.8 % (ref 36.0–46.0)
HEMOGLOBIN: 12.8 g/dL (ref 12.0–15.0)
MCH: 31.3 pg (ref 26.0–34.0)
MCHC: 33.9 g/dL (ref 30.0–36.0)
MCV: 92.4 fL (ref 78.0–100.0)
Platelets: 150 10*3/uL (ref 150–400)
RBC: 4.09 MIL/uL (ref 3.87–5.11)
RDW: 13.1 % (ref 11.5–15.5)
WBC: 7 10*3/uL (ref 4.0–10.5)

## 2017-11-15 LAB — I-STAT TROPONIN, ED: Troponin i, poc: 0 ng/mL (ref 0.00–0.08)

## 2017-11-15 NOTE — ED Triage Notes (Signed)
Pt arrives via EMS from assisted living facility [facility] in [place] with CP since 1430 this afternoon. Staff at facility tried mylanta with no relief, EMS gave 324 MG aspirin and 1 sl nitro with relief of pain. No IV established in the field.

## 2017-11-16 ENCOUNTER — Emergency Department (HOSPITAL_COMMUNITY): Payer: Medicare Other

## 2017-11-16 ENCOUNTER — Emergency Department (HOSPITAL_COMMUNITY)
Admission: EM | Admit: 2017-11-16 | Discharge: 2017-11-16 | Disposition: A | Discharge status: Home / Self Care | Payer: Medicare Other | Attending: Emergency Medicine | Admitting: Emergency Medicine

## 2017-11-16 DIAGNOSIS — R079 Chest pain, unspecified: Secondary | ICD-10-CM

## 2017-11-16 LAB — RAPID URINE DRUG SCREEN, HOSP PERFORMED
Amphetamines: NOT DETECTED
Barbiturates: NOT DETECTED
Benzodiazepines: NOT DETECTED
COCAINE: NOT DETECTED
OPIATES: NOT DETECTED
TETRAHYDROCANNABINOL: NOT DETECTED

## 2017-11-16 LAB — I-STAT TROPONIN, ED: Troponin i, poc: 0 ng/mL (ref 0.00–0.08)

## 2017-11-16 LAB — URINALYSIS, ROUTINE W REFLEX MICROSCOPIC
BILIRUBIN URINE: NEGATIVE
Glucose, UA: NEGATIVE mg/dL
KETONES UR: 5 mg/dL — AB
LEUKOCYTES UA: NEGATIVE
NITRITE: NEGATIVE
Protein, ur: NEGATIVE mg/dL
SPECIFIC GRAVITY, URINE: 1.02 (ref 1.005–1.030)
pH: 5 (ref 5.0–8.0)

## 2017-11-16 LAB — AMMONIA: Ammonia: 25 umol/L (ref 9–35)

## 2017-11-16 MED ORDER — POTASSIUM CHLORIDE CRYS ER 20 MEQ PO TBCR
20.0000 meq | EXTENDED_RELEASE_TABLET | Freq: Once | ORAL | Status: AC
  Administered 2017-11-16: 20 meq via ORAL
  Filled 2017-11-16: qty 1

## 2017-11-16 MED ORDER — HYDROCHLOROTHIAZIDE 12.5 MG PO CAPS
12.5000 mg | ORAL_CAPSULE | Freq: Every day | ORAL | Status: DC
  Administered 2017-11-16: 12.5 mg via ORAL
  Filled 2017-11-16 (×2): qty 1

## 2017-11-16 MED ORDER — ACETAMINOPHEN 325 MG PO TABS
650.0000 mg | ORAL_TABLET | Freq: Once | ORAL | Status: AC
  Administered 2017-11-16: 650 mg via ORAL
  Filled 2017-11-16: qty 2

## 2017-11-16 MED ORDER — ACETAMINOPHEN 325 MG PO TABS
650.0000 mg | ORAL_TABLET | Freq: Once | ORAL | Status: DC
  Filled 2017-11-16: qty 2

## 2017-11-16 MED ORDER — HYDROCHLOROTHIAZIDE 12.5 MG PO CAPS: 12.5000 mg | ORAL_CAPSULE | Freq: Every day | ORAL | Status: DC

## 2017-11-16 NOTE — ED Provider Notes (Signed)
Medical screening examination/treatment/procedure(s) were conducted as a shared visit with non-physician practitioner(s) and myself.  I personally evaluated the patient during the encounter.   EKG Interpretation  Date/Time:  Monday November 15 2017 20:47:32 EST Ventricular Rate:  71 PR Interval:  176 QRS Duration: 100 QT Interval:  392 QTC Calculation: 425 R Axis:   -43 Text Interpretation:  Sinus rhythm with Premature atrial complexes Left axis deviation Possible Anterior infarct , age undetermined Abnormal ECG no acute change from previous Confirmed by Arby Barrette [phone]) on 11/16/2017 10:02:22 AM      She has a somewhat variable complaint.  From the patient she reports she is just not felt very good and had a dizzy component to her symptoms.  Patient was sent from nursing home facility for chest pain.  Patient at this time is not identifying chest pain.  Patient is in no acute distress.  She is alert and appropriate.  She is answering questions.  Heart is regular no gross rub murmur gallop.  Lungs grossly clear.  Abdomen is soft.  No significant peripheral edema.  We will proceed with diagnostic workup rule out cardiac ischemic disease, infectious etiology or strokelike symptoms.  At this time, CT shows no acute findings.  Neurologic is nonfocal and I have low suspicion for CVA or TIA.  I agree with plan of management.   Arby Barrette, MD 11/16/17 1220

## 2017-11-16 NOTE — Discharge Instructions (Signed)
You can take Tylenol or Ibuprofen as directed for pain. You can alternate Tylenol and Ibuprofen every 4 hours. If you take Tylenol at 1pm, then you can take Ibuprofen at 5pm. Then you can take Tylenol again at 9pm.   Take your medications as directed.   Follow-up with your primary care doctor in 24-48 hours for further evaluation.   Return to the Emergency Department for any worsening pain, difficulty breathing, nausea/vomiting, difficulty walking or any other concerns.

## 2017-11-16 NOTE — ED Notes (Signed)
Pt ambulated in hallway per her normal she states.

## 2017-11-16 NOTE — ED Provider Notes (Signed)
[facility] EMERGENCY DEPARTMENT Provider Note   CSN: [id number] Arrival date & time: 11/15/17  2047     History   Chief Complaint Chief Complaint  Patient presents with  . Chest Pain    HPI Emma [name] is a 83 y.o. female with PMH/o CAD, HTN, RA who presents with left sided chest pain that began yesterday afternoon. Patient does not recall much information regarding the chest pain. She states that it was worse with deep inspiration but does not recall any other alleviating or aggravating factors. She states it was not worse with exertion. She denies any associated SOB, diaphoresis, nausea. On ED room arrival, patient reports that chest pain has significantly improved since being in the ED. She states that she has not had pain for several hours. She denies any nausea/vomiting or diaphoresis. Patient reports that for the last 5 days she's felt "a crawling sensation" on her head. In addition, she felt like for the last 5 days she has "been not right in the head, like I'm drunk." She states that she has been ambulating without difficulty. She denies any dizziness. She denies any preceding trauma, injury, fall. Patient denies any fevers, SOB, abdominal pain, nausea/vomiting, dysuria, hematuria, numbness/weakness of extremities.   The history is provided by the patient.    Past Medical History:  Diagnosis Date  . Coronary artery disease   . Hypertension   . RA (rheumatoid arthritis) [facility])     Patient Active Problem List   Diagnosis Date Noted  . Paranoid [facility])     Past Surgical History:  Procedure Laterality Date  . stents      OB History    No data available       Home Medications    Prior to Admission medications   Medication Sig Start Date End Date Taking? Authorizing Provider  amLODipine (NORVASC) 5 MG tablet Take 5 mg by mouth daily. 11/05/17  Yes [provider]  benztropine (COGENTIN) 0.5 MG tablet Take 0.5 tablets by mouth 2 (two) times  daily. 11/08/17  Yes [provider]  hydrochlorothiazide (MICROZIDE) 12.5 MG capsule Take 12.5 capsules by mouth daily. 11/08/17  Yes [provider]  lisinopril (PRINIVIL,ZESTRIL) 20 MG tablet Take 20 mg by mouth daily. 11/08/17  Yes [provider]  risperiDONE (RISPERDAL) 1 MG tablet Take 1 mg by mouth daily. 11/05/17  Yes [provider]  risperiDONE (RISPERDAL) 2 MG tablet Take 2 mg by mouth daily. At bedtime 11/08/17  Yes [provider]  traMADol (ULTRAM) 50 MG tablet Take 1 tablet (50 mg total) by mouth every 6 (six) hours as needed. 09/16/17  Yes Rolan Bucco, MD  traZODone (DESYREL) 100 MG tablet Take 100 mg by mouth at bedtime. 11/08/17 12/08/17 Yes [provider]  ibuprofen (ADVIL,MOTRIN) 400 MG tablet Take 1 tablet (400 mg total) by mouth every 6 (six) hours as needed. Patient not taking: Reported on 11/16/2017 01/29/14   Marlon Pel, PA-C  lisinopril-hydrochlorothiazide (PRINZIDE,ZESTORETIC) 10-12.5 MG per tablet Take 1 tablet by mouth daily. Patient not taking: Reported on 10/04/2017 05/15/15   Hayden Rasmussen, NP  TRAVATAN Z 0.004 % SOLN ophthalmic solution Place 1 drop into both eyes at bedtime. 08/12/17   [provider]  traZODone (DESYREL) 50 MG tablet Take 50 mg by mouth at bedtime as needed for sleep. 11/08/17   [provider]    Family History History reviewed. No pertinent family history.  Social History Social History   Tobacco Use  .  Smoking status: Never Smoker  . Smokeless tobacco: Never Used  Substance Use Topics  . Alcohol use: No  . Drug use: Not on file     Allergies   Patient has no known allergies.   Review of Systems Review of Systems  Constitutional: Negative for chills and fever.  Eyes: Negative for visual disturbance.  Respiratory: Negative for cough and shortness of breath.   Cardiovascular: Positive for chest pain.  Gastrointestinal: Negative for abdominal pain, nausea  and vomiting.  Genitourinary: Negative for dysuria and hematuria.  Skin: Negative for rash.  Neurological: Negative for dizziness, weakness, numbness and headaches.     Physical Exam Updated Vital Signs BP (!) 148/93 (BP Location: Right Arm)   Pulse 78   Temp 97.9 F (36.6 C) (Oral)   Resp 16   Ht 5\' 10"  (1.778 m)   Wt 79.4 kg (175 lb)   SpO2 97%   BMI 25.11 kg/m   Physical Exam  Constitutional: She is oriented to person, place, and time. She appears well-developed and well-nourished.  Elderly appearing  HENT:  Head: Normocephalic and atraumatic.  Mouth/Throat: Oropharynx is clear and moist and mucous membranes are normal.  Eyes: Conjunctivae, EOM and lids are normal. Pupils are equal, round, and reactive to light.  No nystagmus  Neck: Full passive range of motion without pain.  Cardiovascular: Normal rate, regular rhythm, normal heart sounds and normal pulses. Exam reveals no gallop and no friction rub.  No murmur heard. Pulmonary/Chest: Effort normal and breath sounds normal.  Abdominal: Soft. Normal appearance. There is no tenderness. There is no rigidity and no guarding.  Musculoskeletal: Normal range of motion.  Neurological: She is alert and oriented to person, place, and time.  Cranial nerves III-XII intact Follows commands, Moves all extremities  5/5 strength to BUE and BLE  Sensation intact throughout all major nerve distributions Normal finger to nose. No dysdiadochokinesia. No pronator drift. No gait abnormalities  No slurred speech. No facial droop.   Skin: Skin is warm and dry. Capillary refill takes less than 2 seconds.  Psychiatric: She has a normal mood and affect. Her speech is normal.  Nursing note and vitals reviewed.    ED Treatments / Results  Labs (all labs ordered are listed, but only abnormal results are displayed) Labs Reviewed  BASIC METABOLIC PANEL - Abnormal; Notable for the following components:      Result Value   Sodium 134 (*)      Potassium 3.3 (*)    Glucose, Bld 122 (*)    GFR calc non Af Amer 59 (*)    All other components within normal limits  URINALYSIS, ROUTINE W REFLEX MICROSCOPIC - Abnormal; Notable for the following components:   APPearance HAZY (*)    Hgb urine dipstick SMALL (*)    Ketones, ur 5 (*)    Bacteria, UA RARE (*)    Squamous Epithelial / LPF 0-5 (*)    All other components within normal limits  CBC  RAPID URINE DRUG SCREEN, HOSP PERFORMED  AMMONIA  I-STAT TROPONIN, ED  I-STAT TROPONIN, ED    EKG  EKG Interpretation  Date/Time:  Monday November 15 2017 20:47:32 EST Ventricular Rate:  71 PR Interval:  176 QRS Duration: 100 QT Interval:  392 QTC Calculation: 425 R Axis:   -43 Text Interpretation:  Sinus rhythm with Premature atrial complexes Left axis deviation Possible Anterior infarct , age undetermined Abnormal ECG no acute change from previous Confirmed by Arby BarrettePfeiffer, Marcy [phone]([id number]) on 11/16/2017  10:02:22 AM       Radiology Dg Chest 2 View  Result Date: 11/15/2017 CLINICAL DATA:  Pt arrives via EMS from assisted living facility [facility] in [place] with CP since 1430 this afternoon. Staff at facility tried mylanta with no relief, EMS gave 324 MG aspirin and 1 sl nitro with relief of pain EXAM: CHEST  2 VIEW COMPARISON:  10/15/2017 FINDINGS: Cardiac silhouette is normal in size. No mediastinal or hilar masses. No evidence of adenopathy. Clear lungs.  No pleural effusion or pneumothorax. Skeletal structures are intact. IMPRESSION: No acute cardiopulmonary disease. Electronically Signed   By: Amie Portland M.D.   On: 11/15/2017 21:26   Ct Head Wo Contrast  Result Date: 11/16/2017 CLINICAL DATA:  Head trauma with ataxia.  Initial encounter. EXAM: CT HEAD WITHOUT CONTRAST TECHNIQUE: Contiguous axial images were obtained from the base of the skull through the vertex without intravenous contrast. COMPARISON:  10/15/2017 FINDINGS: Brain: No evidence of acute infarction,  hemorrhage, hydrocephalus, extra-axial collection or mass lesion/mass effect. Mild for age patchy low-density in the cerebral white matter compatible with chronic small vessel ischemia. Vascular: No hyperdense vessel or unexpected calcification. Skull: Negative for fracture.  Partly visualized torus palatini. Sinuses/Orbits: Soft tissue densities in the bilateral maxillary sinuses, likely retention cysts. No acute finding. Bilateral cataract resection. IMPRESSION: No acute finding or change from prior. Electronically Signed   By: Marnee Spring M.D.   On: 11/16/2017 10:56    Procedures Procedures (including critical care time)  Medications Ordered in ED Medications  potassium chloride SA (K-DUR,KLOR-CON) CR tablet 20 mEq (20 mEq Oral Given 11/16/17 1520)  acetaminophen (TYLENOL) tablet 650 mg (650 mg Oral Given 11/16/17 1524)     Initial Impression / Assessment and Plan / ED Course  I have reviewed the triage vital signs and the nursing notes.  Pertinent labs & imaging results that were available during my care of the patient were reviewed by me and considered in my medical decision making (see chart for details).     83 y.o. female who presents with left-sided chest pain that began at 4:30 PM last afternoon.  Sent by rehab facility home where she is currently staying.  On initial evaluation, patient reports that chest pain has significantly improved but now she is reporting "feeling funny" but cannot quantify further.  No recent sicknesses, fevers, chills.  No difficulty breathing, nausea/vomiting. Patient is afebrile, non-toxic appearing, sitting comfortably on examination table. Vital signs reviewed and stable.  Patient will intermittently engage in tremulous activity but appears to be intermittent.  Patient is distractible and when he start conversing with her, her tremors improved.  They worsen with new bring attention to them and patient states "I just do not know why I am shaking."   Patient is tremulous throughout neuro exam but with no focal neuro deficit noted.  Consider ACS etiology versus acute infectious etiology versus anxiety versus musculoskeletal pain.  Low suspicion for ICH at this time, but will obtain head CT given no report from nursing home. Initial labs and imaging ordered in triage, including CBC, BMP, EKG, troponin, chest x-ray.  I discussed with Corrie Dandy Banker) at [facility] where patient is a current resident.  Per her report, patient started complaining of left-sided chest pain last night approximately 4:30 PM.  Patient had no complaints of her head or any other issues.  Patient was sent to the emergency department with chest pain persisted.  They deny any history of trauma, injury,  fall.  They state she has otherwise been in her normal health.  Labs and imaging reviewed.  CBC unremarkable.  BMP shows slight hypokalemia, otherwise unremarkable.  Troponin negative.  EKG shows left axis deviation which is consistent with previous EKG.  Chest x-ray negative for an acute infectious etiology.  Discussed results with patient.  Patient reports still feeling off. When I attempt to ambulate her, she is able to ambulate small distances but overall appears intermittently tremulous.  We will add on UA and pneumonia for further evaluation.  Do not suspect CVA at this time.  CT head reviewed.  Negative for any acute ICH.  Ammonia level unremarkable.  UA unremarkable.  UDS is unremarkable.  Reevaluation shows the patient is no longer tremulous.  She reports improvement in chest pain states that she has not had chest pain several hours.  Ambulate patient in the department and she had better ambulating without any complaints.  Patient is slightly hypertensive.  She has not taken any of her daily medications.  We will plan to give her 1 dose of antihypertensives in the emergency department and reassess.  Patient ambulated to the bathroom without any difficulty.  She is  tolerating p.o. in the department without any difficulty.  Blood pressure is improved after her HCTZ dose given in the department.  Patient stable for discharge at this time.  Encourage patient to follow-up with her primary care doctor in the next 24-48 hours for further evaluation. Strict return precautions discussed. Patient expresses understanding and agreement to plan.    Final Clinical Impressions(s) / ED Diagnoses   Final diagnoses:  Chest pain, unspecified type    ED Discharge Orders    None       Rosana Hoes 11/17/17 1734    Arby Barrette, MD 11/23/17 2353

## 2018-03-24 ENCOUNTER — Emergency Department (HOSPITAL_COMMUNITY)
Admission: EM | Admit: 2018-03-24 | Discharge: 2018-03-25 | Disposition: A | Discharge status: Home / Self Care | Payer: Medicare HMO | Attending: Physician Assistant | Admitting: Physician Assistant

## 2018-03-24 ENCOUNTER — Emergency Department (HOSPITAL_COMMUNITY): Payer: Medicare HMO

## 2018-03-24 ENCOUNTER — Encounter (HOSPITAL_COMMUNITY): Payer: Self-pay

## 2018-03-24 ENCOUNTER — Other Ambulatory Visit: Payer: Self-pay

## 2018-03-24 DIAGNOSIS — R51 Headache: Secondary | ICD-10-CM | POA: Diagnosis present

## 2018-03-24 DIAGNOSIS — Z79899 Other long term (current) drug therapy: Secondary | ICD-10-CM | POA: Insufficient documentation

## 2018-03-24 DIAGNOSIS — I10 Essential (primary) hypertension: Secondary | ICD-10-CM | POA: Insufficient documentation

## 2018-03-24 DIAGNOSIS — R6889 Other general symptoms and signs: Secondary | ICD-10-CM

## 2018-03-24 DIAGNOSIS — I2510 Atherosclerotic heart disease of native coronary artery without angina pectoris: Secondary | ICD-10-CM | POA: Insufficient documentation

## 2018-03-24 LAB — CBC WITH DIFFERENTIAL/PLATELET
Basophils Absolute: 0 10*3/uL (ref 0.0–0.1)
Basophils Relative: 0 %
EOS PCT: 5 %
Eosinophils Absolute: 0.3 10*3/uL (ref 0.0–0.7)
HCT: 38.9 % (ref 36.0–46.0)
Hemoglobin: 12.9 g/dL (ref 12.0–15.0)
LYMPHS ABS: 2.7 10*3/uL (ref 0.7–4.0)
LYMPHS PCT: 40 %
MCH: 30.7 pg (ref 26.0–34.0)
MCHC: 33.2 g/dL (ref 30.0–36.0)
MCV: 92.6 fL (ref 78.0–100.0)
Monocytes Absolute: 0.4 10*3/uL (ref 0.1–1.0)
Monocytes Relative: 6 %
Neutro Abs: 3.3 10*3/uL (ref 1.7–7.7)
Neutrophils Relative %: 49 %
PLATELETS: 211 10*3/uL (ref 150–400)
RBC: 4.2 MIL/uL (ref 3.87–5.11)
RDW: 13.2 % (ref 11.5–15.5)
WBC: 6.7 10*3/uL (ref 4.0–10.5)

## 2018-03-24 LAB — COMPREHENSIVE METABOLIC PANEL
ALK PHOS: 50 U/L (ref 38–126)
ALT: 14 U/L (ref 14–54)
AST: 15 U/L (ref 15–41)
Albumin: 4.1 g/dL (ref 3.5–5.0)
Anion gap: 9 (ref 5–15)
BUN: 8 mg/dL (ref 6–20)
CALCIUM: 10 mg/dL (ref 8.9–10.3)
CO2: 25 mmol/L (ref 22–32)
CREATININE: 0.74 mg/dL (ref 0.44–1.00)
Chloride: 102 mmol/L (ref 101–111)
Glucose, Bld: 104 mg/dL — ABNORMAL HIGH (ref 65–99)
Potassium: 4 mmol/L (ref 3.5–5.1)
Sodium: 136 mmol/L (ref 135–145)
TOTAL PROTEIN: 7.2 g/dL (ref 6.5–8.1)
Total Bilirubin: 0.5 mg/dL (ref 0.3–1.2)

## 2018-03-24 LAB — URINALYSIS, ROUTINE W REFLEX MICROSCOPIC
Bilirubin Urine: NEGATIVE
Glucose, UA: NEGATIVE mg/dL
HGB URINE DIPSTICK: NEGATIVE
Ketones, ur: NEGATIVE mg/dL
Leukocytes, UA: NEGATIVE
NITRITE: NEGATIVE
Protein, ur: NEGATIVE mg/dL
SPECIFIC GRAVITY, URINE: 1.004 — AB (ref 1.005–1.030)
pH: 6 (ref 5.0–8.0)

## 2018-03-24 LAB — C-REACTIVE PROTEIN

## 2018-03-24 LAB — I-STAT CG4 LACTIC ACID, ED: Lactic Acid, Venous: 0.98 mmol/L (ref 0.5–1.9)

## 2018-03-24 LAB — I-STAT TROPONIN, ED: Troponin i, poc: 0 ng/mL (ref 0.00–0.08)

## 2018-03-24 LAB — SEDIMENTATION RATE: SED RATE: 8 mm/h (ref 0–22)

## 2018-03-24 NOTE — ED Notes (Signed)
Patient given discharge instructions and verbalized understanding.  Patient stable to discharge at this time.  Patient is alert and oriented to baseline.  No distressed noted at this time.  All belongings taken with the patient at discharge.   

## 2018-03-24 NOTE — Discharge Instructions (Signed)
It is unclear to [place] what exactly your symptoms are today.  We want you to follow-up with your primary care physician within 24-48 hours.  If you develop headache, visual trouble, or worsening pain in your jaw while eating please return to follow-up with your primary care physician.  Please return if you have any chest pain.

## 2018-03-24 NOTE — ED Provider Notes (Signed)
[facility] EMERGENCY DEPARTMENT Provider Note   CSN: [id number] Arrival date & time: 03/24/18  1655     History   Chief Complaint Chief Complaint  Patient presents with  . Dizziness  . Headache    HPI Emma [name] is a 84 y.o. female.  HPI   Patient is an 84 year old female presenting with unclear complaint.  Patient reports to me that she is here because when she chews and swallows she feels like she has cold coming up into her throat.  Patient is a very difficult historian.  She talked to the nurse primarily about her concerns that she might have a stroke some day.  She talked to the EMS primarily about dizziness.  She talked to me primarily about feelings of fullness in her throat when she eats a full meal.  Patient also mentioned that she had a headache in the center of her head occasionally.  When asked about visual changes she reports that sometimes she has blurred vision she cannot tell me when how often or the last that happened.     Past Medical History:  Diagnosis Date  . Coronary artery disease   . Hypertension   . RA (rheumatoid arthritis) [facility])     Patient Active Problem List   Diagnosis Date Noted  . Paranoid [facility])     Past Surgical History:  Procedure Laterality Date  . stents       OB History   None      Home Medications    Prior to Admission medications   Medication Sig Start Date End Date Taking? Authorizing Provider  amLODipine (NORVASC) 5 MG tablet Take 5 mg by mouth daily. 11/05/17  Yes [provider]  benztropine (COGENTIN) 0.5 MG tablet Take 0.5 tablets by mouth 2 (two) times daily. 11/08/17  Yes [provider]  cholecalciferol (VITAMIN D) 1000 units tablet Take 1,000 Units by mouth daily.   Yes [provider]  hydrochlorothiazide (MICROZIDE) 12.5 MG capsule Take 12.5 capsules by mouth daily. 11/08/17  Yes [provider]  lisinopril (PRINIVIL,ZESTRIL) 20 MG tablet Take 20 mg by  mouth daily. 11/08/17  Yes [provider]  risperiDONE (RISPERDAL) 1 MG tablet Take 1 mg by mouth daily. 11/05/17  Yes [provider]  risperiDONE (RISPERDAL) 2 MG tablet Take 2 mg by mouth daily. At bedtime 11/08/17  Yes [provider]  TRAVATAN Z 0.004 % SOLN ophthalmic solution Place 1 drop into both eyes at bedtime. 08/12/17  Yes [provider]  traZODone (DESYREL) 50 MG tablet Take 50 mg by mouth at bedtime as needed for sleep. 11/08/17  Yes [provider]  vitamin A 10000 UNIT capsule Take 10,000 Units by mouth daily.   Yes [provider]  vitamin B-12 (CYANOCOBALAMIN) 100 MCG tablet Take 100 mcg by mouth daily. 11/05/17  Yes [provider]  vitamin C (ASCORBIC ACID) 500 MG tablet Take 500 mg by mouth daily.   Yes [provider]  ibuprofen (ADVIL,MOTRIN) 400 MG tablet Take 1 tablet (400 mg total) by mouth every 6 (six) hours as needed. Patient not taking: Reported on 11/16/2017 01/29/14   Delos Haring, PA-C  lisinopril-hydrochlorothiazide (PRINZIDE,ZESTORETIC) 10-12.5 MG per tablet Take 1 tablet by mouth daily. Patient not taking: Reported on 10/04/2017 05/15/15   Janne Napoleon, NP  traMADol (ULTRAM) 50 MG tablet Take 1 tablet (50 mg total) by mouth every 6 (six) hours as needed. Patient not taking: Reported on 03/24/2018 09/16/17   Belfi,  Threasa Beards, MD    Family History History reviewed. No pertinent family history.  Social History Social History   Tobacco Use  . Smoking status: Never Smoker  . Smokeless tobacco: Never Used  Substance Use Topics  . Alcohol use: No  . Drug use: Not on file     Allergies   Patient has no known allergies.   Review of Systems Review of Systems  Constitutional: Negative for activity change, fatigue and fever.  HENT: Positive for trouble swallowing.   Respiratory: Negative for chest tightness and shortness of breath.   Cardiovascular: Negative for chest pain.    Gastrointestinal: Negative for abdominal pain.  Neurological: Positive for headaches.  All other systems reviewed and are negative.    Physical Exam Updated Vital Signs BP (!) 146/84   Pulse 80   Temp 98.4 F (36.9 C) (Oral)   Resp 20   Ht '5\' 10"'$  (1.778 m)   Wt 79.4 kg (175 lb)   SpO2 100%   BMI 25.11 kg/m   Physical Exam  Constitutional: She appears well-developed and well-nourished.  HENT:  Head: Normocephalic and atraumatic.  Mouth/Throat: Oropharynx is clear and moist.  Ears with cerumen  Eyes: Pupils are equal, round, and reactive to light. EOM are normal. Right eye exhibits no discharge.  Neck: Normal range of motion. Neck supple. No neck rigidity. No tracheal deviation present.  Cardiovascular: Normal rate, regular rhythm and normal heart sounds.  No murmur heard. Pulmonary/Chest: Effort normal and breath sounds normal. She has no wheezes. She has no rales.  Abdominal: Soft. She exhibits no distension. There is no tenderness.  Neurological:  Oriented to person place and situation.  Very sweet, inconsistnet story.  Skin: Skin is warm and dry. She is not diaphoretic.  Psychiatric: She has a normal mood and affect.  Nursing note and vitals reviewed.    ED Treatments / Results  Labs (all labs ordered are listed, but only abnormal results are displayed) Labs Reviewed  COMPREHENSIVE METABOLIC PANEL - Abnormal; Notable for the following components:      Result Value   Glucose, Bld 104 (*)    All other components within normal limits  CBC WITH DIFFERENTIAL/PLATELET  URINALYSIS, ROUTINE W REFLEX MICROSCOPIC  C-REACTIVE PROTEIN  SEDIMENTATION RATE  I-STAT CG4 LACTIC ACID, ED  I-STAT TROPONIN, ED    EKG EKG Interpretation  Date/Time:  Thursday March 24 2018 17:22:24 EDT Ventricular Rate:  69 PR Interval:    QRS Duration: 93 QT Interval:  398 QTC Calculation: 427 R Axis:   -39 Text Interpretation:  Sinus rhythm Atrial premature complex Left axis  deviation Normal sinus rhythm Confirmed by Thomasene Lot, Rodolphe Edmonston [phone]) on 03/24/2018 5:24:57 PM Also confirmed by Zenovia Jarred [phone])  on 03/24/2018 7:49:06 PM   Radiology Dg Chest 2 View  Result Date: 03/24/2018 CLINICAL DATA:  84 year old female with chest pain. Left side neurologic deficits. Possible stroke. EXAM: CHEST - 2 VIEW COMPARISON:  Chest radiographs 11/15/2017. FINDINGS: Upright AP and lateral views of the chest. Lung volumes are stable and within normal limits. Stable mild cardiomegaly and mediastinal contours. Visualized tracheal air column is within normal limits. Both lungs appear clear. No pneumothorax. No pleural effusion is evident. No acute osseous abnormality identified. Negative visible bowel gas pattern. IMPRESSION: No acute cardiopulmonary abnormality. Electronically Signed   By: Genevie Ann M.D.   On: 03/24/2018 18:56   Ct Head Wo Contrast  Result Date: 03/24/2018 CLINICAL DATA:  Left-sided headache radiating down the left side of the neck  EXAM: CT HEAD WITHOUT CONTRAST TECHNIQUE: Contiguous axial images were obtained from the base of the skull through the vertex without intravenous contrast. COMPARISON:  None. FINDINGS: Brain: No evidence of accelerated atrophy. No sign of old or acute focal small or large vessel infarction. No mass lesion, hemorrhage, hydrocephalus or extra-axial collection. Vascular: There is atherosclerotic calcification of the major vessels at the base of the brain. Skull: Normal Sinuses/Orbits: Mild mucosal inflammatory changes of the paranasal sinuses, frequently seen and often subclinical. Other: None IMPRESSION: No cause of the presenting symptoms is identified. Normal appearance of the brain for age. Ordinary atherosclerotic calcification of the major vessels at the base the brain. Mild sinus mucosal thickening. Electronically Signed   By: Nelson Chimes M.D.   On: 03/24/2018 19:26    Procedures Procedures (including critical care time)  Medications  Ordered in ED Medications - No data to display   Initial Impression / Assessment and Plan / ED Course  I have reviewed the triage vital signs and the nursing notes.  Pertinent labs & imaging results that were available during my care of the patient were reviewed by me and considered in my medical decision making (see chart for details).     Patient is an 84 year old female presenting with unclear complaint.  Patient reports to me that she is here because when she chews and swallows she feels like she has cold coming up into her throat.  Patient is a very difficult historian.  She talked to the nurse primarily about her concerns that she might have a stroke some day.  She talked to the EMS primarily about dizziness.  She talked to me primarily about feelings of fullness in her throat when she eats a full meal.  Patient also mentioned that she had a headache in the center of her head occasionally.  When asked about visual changes she reports that sometimes she has blurred vision she cannot tell me when how often or the last that happened.   10:00 PM Will start with broad workup given patient's very difficult to understand story.  10:00 PM Patient CT is normal.  Patient's labs are all reassuring.  EKG is nonischemic.  Vital signs are stable.  On back to reevaluate patient now she is worried that maybe is just cerumen in her ears.  Will try to remove that to see if it helps.  I try to clarify more about what is going on.  She reported again that it is when she is chewing that she gets a feeling of fullness.  But then also pain in her legs when she is walking.  Is very on hard to understand patient and follow her story.   We will send her with instructions to follow-up with her primary care to follow-up.  It is unclear to me what patient's presenting symptoms even are today.  You do worry about temporal arteritis given she is descripting symptoms after eating.  But pateinet is just so inconsistnet in  her story.   ESR CRP will be sent follow-up with PCP would be reasonable next step.  Given that she cannot reliably tell me whether she is had any visual changes, whether her jaw actually hurts, or whether she has a headache, the only thing that I know is that she does not have any tenderness with palpation of the temporal artery.  Therefore I do not think I should start high-dose steroids given the risks, will have her follow-up with her primary care physician who might  be able get a better story from patient.  And you can better evaluate the benefits and risks.  Final Clinical Impressions(s) / ED Diagnoses   Final diagnoses:  None    ED Discharge Orders    None       Macarthur Critchley, MD 03/24/18 2202

## 2018-03-24 NOTE — ED Notes (Signed)
Pt was ambulated in hall x1 assist and tolerated well.

## 2018-03-24 NOTE — ED Triage Notes (Signed)
Pt arrives to ED from [facility] with complaints of dizziness on and off x2 weeks and since left sided headache x a couple days. EMS reports pt ambulates with a cane, NSR en route, no meds pta. Pt placed in position of comfort with bed locked and lowered, call bell in reach.

## 2018-03-24 NOTE — ED Provider Notes (Signed)
Assumed care from Dr. Thomasene Lot at shift change.  See prior notes for full H&P.  Briefly, 84 y.o. F here with unclear complaints.  Patient is a difficult historian.  Apparently has had some type of headache, nasal congestion, sensation of something in her throat, etc.  Work up thus far has been reassuring.  Plan:  UA, ESR, CRP pending.  If reassuring, can be discharged home to follow-up with PCP.  Results for orders placed or performed during the hospital encounter of 03/24/18  Comprehensive metabolic panel  Result Value Ref Range   Sodium 136 135 - 145 mmol/L   Potassium 4.0 3.5 - 5.1 mmol/L   Chloride 102 101 - 111 mmol/L   CO2 25 22 - 32 mmol/L   Glucose, Bld 104 (H) 65 - 99 mg/dL   BUN 8 6 - 20 mg/dL   Creatinine, Ser 0.74 0.44 - 1.00 mg/dL   Calcium 10.0 8.9 - 10.3 mg/dL   Total Protein 7.2 6.5 - 8.1 g/dL   Albumin 4.1 3.5 - 5.0 g/dL   AST 15 15 - 41 U/L   ALT 14 14 - 54 U/L   Alkaline Phosphatase 50 38 - 126 U/L   Total Bilirubin 0.5 0.3 - 1.2 mg/dL   GFR calc non Af Amer >60 >60 mL/min   GFR calc Af Amer >60 >60 mL/min   Anion gap 9 5 - 15  CBC with Differential/Platelet  Result Value Ref Range   WBC 6.7 4.0 - 10.5 K/uL   RBC 4.20 3.87 - 5.11 MIL/uL   Hemoglobin 12.9 12.0 - 15.0 g/dL   HCT 38.9 36.0 - 46.0 %   MCV 92.6 78.0 - 100.0 fL   MCH 30.7 26.0 - 34.0 pg   MCHC 33.2 30.0 - 36.0 g/dL   RDW 13.2 11.5 - 15.5 %   Platelets 211 150 - 400 K/uL   Neutrophils Relative % 49 %   Neutro Abs 3.3 1.7 - 7.7 K/uL   Lymphocytes Relative 40 %   Lymphs Abs 2.7 0.7 - 4.0 K/uL   Monocytes Relative 6 %   Monocytes Absolute 0.4 0.1 - 1.0 K/uL   Eosinophils Relative 5 %   Eosinophils Absolute 0.3 0.0 - 0.7 K/uL   Basophils Relative 0 %   Basophils Absolute 0.0 0.0 - 0.1 K/uL  Urinalysis, Routine w reflex microscopic  Result Value Ref Range   Color, Urine STRAW (A) YELLOW   APPearance CLEAR CLEAR   Specific Gravity, Urine 1.004 (L) 1.005 - 1.030   pH 6.0 5.0 - 8.0   Glucose, UA  NEGATIVE NEGATIVE mg/dL   Hgb urine dipstick NEGATIVE NEGATIVE   Bilirubin Urine NEGATIVE NEGATIVE   Ketones, ur NEGATIVE NEGATIVE mg/dL   Protein, ur NEGATIVE NEGATIVE mg/dL   Nitrite NEGATIVE NEGATIVE   Leukocytes, UA NEGATIVE NEGATIVE  C-reactive protein  Result Value Ref Range   CRP <0.8 <1.0 mg/dL  Sedimentation rate  Result Value Ref Range   Sed Rate 8 0 - 22 mm/hr  I-Stat CG4 Lactic Acid, ED  Result Value Ref Range   Lactic Acid, Venous 0.98 0.5 - 1.9 mmol/L  I-stat troponin, ED  Result Value Ref Range   Troponin i, poc 0.00 0.00 - 0.08 ng/mL   Comment 3           Dg Chest 2 View  Result Date: 03/24/2018 CLINICAL DATA:  84 year old female with chest pain. Left side neurologic deficits. Possible stroke. EXAM: CHEST - 2 VIEW COMPARISON:  Chest radiographs 11/15/2017.  FINDINGS: Upright AP and lateral views of the chest. Lung volumes are stable and within normal limits. Stable mild cardiomegaly and mediastinal contours. Visualized tracheal air column is within normal limits. Both lungs appear clear. No pneumothorax. No pleural effusion is evident. No acute osseous abnormality identified. Negative visible bowel gas pattern. IMPRESSION: No acute cardiopulmonary abnormality. Electronically Signed   By: Genevie Ann M.D.   On: 03/24/2018 18:56   Ct Head Wo Contrast  Result Date: 03/24/2018 CLINICAL DATA:  Left-sided headache radiating down the left side of the neck EXAM: CT HEAD WITHOUT CONTRAST TECHNIQUE: Contiguous axial images were obtained from the base of the skull through the vertex without intravenous contrast. COMPARISON:  None. FINDINGS: Brain: No evidence of accelerated atrophy. No sign of old or acute focal small or large vessel infarction. No mass lesion, hemorrhage, hydrocephalus or extra-axial collection. Vascular: There is atherosclerotic calcification of the major vessels at the base of the brain. Skull: Normal Sinuses/Orbits: Mild mucosal inflammatory changes of the paranasal  sinuses, frequently seen and often subclinical. Other: None IMPRESSION: No cause of the presenting symptoms is identified. Normal appearance of the brain for age. Ordinary atherosclerotic calcification of the major vessels at the base the brain. Mild sinus mucosal thickening. Electronically Signed   By: Nelson Chimes M.D.   On: 03/24/2018 19:26    UA, ESR, and CRP all WNL. She was able to ambulate here without issue. Patient will be discharged home to follow-up with her PCP.  She understands to return here for any new/acute changes.   Larene Pickett, PA-C 03/24/18 2330    Gareth Morgan, MD 03/25/18 1352

## 2018-03-28 ENCOUNTER — Encounter (HOSPITAL_COMMUNITY): Payer: Self-pay | Admitting: Emergency Medicine

## 2018-03-28 ENCOUNTER — Emergency Department (HOSPITAL_COMMUNITY): Payer: Medicare HMO

## 2018-03-28 ENCOUNTER — Other Ambulatory Visit: Payer: Self-pay

## 2018-03-28 ENCOUNTER — Inpatient Hospital Stay (HOSPITAL_COMMUNITY)
Admission: EM | Admit: 2018-03-28 | Discharge: 2018-03-31 | DRG: 391 | Disposition: A | Discharge status: Skilled Nursing Facility | Payer: Medicare HMO | Attending: Internal Medicine | Admitting: Internal Medicine

## 2018-03-28 DIAGNOSIS — J181 Lobar pneumonia, unspecified organism: Secondary | ICD-10-CM | POA: Diagnosis present

## 2018-03-28 DIAGNOSIS — G9341 Metabolic encephalopathy: Secondary | ICD-10-CM | POA: Diagnosis present

## 2018-03-28 DIAGNOSIS — R112 Nausea with vomiting, unspecified: Secondary | ICD-10-CM | POA: Diagnosis not present

## 2018-03-28 DIAGNOSIS — I2510 Atherosclerotic heart disease of native coronary artery without angina pectoris: Secondary | ICD-10-CM | POA: Diagnosis present

## 2018-03-28 DIAGNOSIS — R338 Other retention of urine: Secondary | ICD-10-CM | POA: Clinically undetermined

## 2018-03-28 DIAGNOSIS — Y95 Nosocomial condition: Secondary | ICD-10-CM | POA: Diagnosis present

## 2018-03-28 DIAGNOSIS — F0280 Dementia in other diseases classified elsewhere without behavioral disturbance: Secondary | ICD-10-CM | POA: Diagnosis not present

## 2018-03-28 DIAGNOSIS — E876 Hypokalemia: Secondary | ICD-10-CM | POA: Diagnosis not present

## 2018-03-28 DIAGNOSIS — A0811 Acute gastroenteropathy due to Norwalk agent: Secondary | ICD-10-CM | POA: Diagnosis not present

## 2018-03-28 DIAGNOSIS — F0390 Unspecified dementia without behavioral disturbance: Secondary | ICD-10-CM | POA: Diagnosis present

## 2018-03-28 DIAGNOSIS — R197 Diarrhea, unspecified: Secondary | ICD-10-CM | POA: Diagnosis not present

## 2018-03-28 DIAGNOSIS — E860 Dehydration: Secondary | ICD-10-CM | POA: Diagnosis present

## 2018-03-28 DIAGNOSIS — N179 Acute kidney failure, unspecified: Secondary | ICD-10-CM | POA: Diagnosis not present

## 2018-03-28 DIAGNOSIS — M069 Rheumatoid arthritis, unspecified: Secondary | ICD-10-CM | POA: Diagnosis present

## 2018-03-28 DIAGNOSIS — R339 Retention of urine, unspecified: Secondary | ICD-10-CM | POA: Diagnosis not present

## 2018-03-28 DIAGNOSIS — G309 Alzheimer's disease, unspecified: Secondary | ICD-10-CM | POA: Diagnosis not present

## 2018-03-28 DIAGNOSIS — R918 Other nonspecific abnormal finding of lung field: Secondary | ICD-10-CM | POA: Diagnosis present

## 2018-03-28 DIAGNOSIS — J189 Pneumonia, unspecified organism: Secondary | ICD-10-CM | POA: Diagnosis not present

## 2018-03-28 DIAGNOSIS — K529 Noninfective gastroenteritis and colitis, unspecified: Secondary | ICD-10-CM | POA: Diagnosis not present

## 2018-03-28 DIAGNOSIS — I10 Essential (primary) hypertension: Secondary | ICD-10-CM | POA: Diagnosis not present

## 2018-03-28 HISTORY — DX: Unspecified dementia, unspecified severity, without behavioral disturbance, psychotic disturbance, mood disturbance, and anxiety: F03.90

## 2018-03-28 LAB — COMPREHENSIVE METABOLIC PANEL
ALBUMIN: 4.4 g/dL (ref 3.5–5.0)
ALT: 18 U/L (ref 14–54)
ANION GAP: 12 (ref 5–15)
AST: 19 U/L (ref 15–41)
Alkaline Phosphatase: 50 U/L (ref 38–126)
BILIRUBIN TOTAL: 1.1 mg/dL (ref 0.3–1.2)
BUN: 30 mg/dL — ABNORMAL HIGH (ref 6–20)
CO2: 21 mmol/L — ABNORMAL LOW (ref 22–32)
Calcium: 9.7 mg/dL (ref 8.9–10.3)
Chloride: 105 mmol/L (ref 101–111)
Creatinine, Ser: 1.54 mg/dL — ABNORMAL HIGH (ref 0.44–1.00)
GFR calc non Af Amer: 30 mL/min — ABNORMAL LOW (ref >60)
GFR, EST AFRICAN AMERICAN: 35 mL/min — AB (ref >60)
GLUCOSE: 129 mg/dL — AB (ref 65–99)
POTASSIUM: 3.2 mmol/L — AB (ref 3.5–5.1)
Sodium: 138 mmol/L (ref 135–145)
TOTAL PROTEIN: 8.1 g/dL (ref 6.5–8.1)

## 2018-03-28 LAB — CBC
HEMATOCRIT: 41 % (ref 36.0–46.0)
Hemoglobin: 14 g/dL (ref 12.0–15.0)
MCH: 31.4 pg (ref 26.0–34.0)
MCHC: 34.1 g/dL (ref 30.0–36.0)
MCV: 91.9 fL (ref 78.0–100.0)
Platelets: 176 10*3/uL (ref 150–400)
RBC: 4.46 MIL/uL (ref 3.87–5.11)
RDW: 13.3 % (ref 11.5–15.5)
WBC: 7.7 10*3/uL (ref 4.0–10.5)

## 2018-03-28 LAB — LIPASE, BLOOD: Lipase: 23 U/L (ref 11–51)

## 2018-03-28 LAB — TROPONIN I: Troponin I: <0.03 ng/mL (ref <0.03)

## 2018-03-28 MED ORDER — LATANOPROST 0.005 % OP SOLN
1.0000 [drp] | Freq: Every day | OPHTHALMIC | Status: DC
  Administered 2018-03-28 – 2018-03-30 (×3): 1 [drp] via OPHTHALMIC
  Filled 2018-03-28: qty 2.5

## 2018-03-28 MED ORDER — SODIUM CHLORIDE 0.9 % IV SOLN
INTRAVENOUS | Status: DC
  Administered 2018-03-28 – 2018-03-30 (×4): via INTRAVENOUS

## 2018-03-28 MED ORDER — ENOXAPARIN SODIUM 30 MG/0.3ML ~~LOC~~ SOLN
30.0000 mg | Freq: Every day | SUBCUTANEOUS | Status: DC
  Administered 2018-03-28 – 2018-03-29 (×2): 30 mg via SUBCUTANEOUS
  Filled 2018-03-28 (×2): qty 0.3

## 2018-03-28 MED ORDER — TRAZODONE HCL 50 MG PO TABS: 50.0000 mg | ORAL_TABLET | Freq: Every evening | ORAL | Status: DC | PRN

## 2018-03-28 MED ORDER — PANTOPRAZOLE SODIUM 20 MG PO TBEC
20.0000 mg | DELAYED_RELEASE_TABLET | Freq: Once | ORAL | Status: DC
  Filled 2018-03-28: qty 1

## 2018-03-28 MED ORDER — BENZTROPINE MESYLATE 0.5 MG PO TABS
0.2500 mg | ORAL_TABLET | Freq: Two times a day (BID) | ORAL | Status: DC
  Administered 2018-03-28 – 2018-03-31 (×6): 0.25 mg via ORAL
  Filled 2018-03-28 (×7): qty 1

## 2018-03-28 MED ORDER — RISPERIDONE 2 MG PO TABS
2.0000 mg | ORAL_TABLET | Freq: Every day | ORAL | Status: DC
  Administered 2018-03-28 – 2018-03-30 (×3): 2 mg via ORAL
  Filled 2018-03-28 (×4): qty 1

## 2018-03-28 MED ORDER — POTASSIUM CHLORIDE CRYS ER 20 MEQ PO TBCR
40.0000 meq | EXTENDED_RELEASE_TABLET | Freq: Once | ORAL | Status: AC
  Administered 2018-03-28: 40 meq via ORAL
  Filled 2018-03-28: qty 2

## 2018-03-28 MED ORDER — RISPERIDONE 1 MG PO TABS
1.0000 mg | ORAL_TABLET | Freq: Every day | ORAL | Status: DC
  Administered 2018-03-29 – 2018-03-31 (×3): 1 mg via ORAL
  Filled 2018-03-28 (×3): qty 1

## 2018-03-28 MED ORDER — SODIUM CHLORIDE 0.9 % IV SOLN: 1.0000 g | Freq: Once | INTRAVENOUS | Status: DC

## 2018-03-28 MED ORDER — ENOXAPARIN SODIUM 40 MG/0.4ML ~~LOC~~ SOLN: 40.0000 mg | SUBCUTANEOUS | Status: DC

## 2018-03-28 MED ORDER — SODIUM CHLORIDE 0.9 % IV SOLN
500.0000 mg | INTRAVENOUS | Status: DC
  Administered 2018-03-28 – 2018-03-30 (×3): 500 mg via INTRAVENOUS
  Filled 2018-03-28 (×3): qty 500

## 2018-03-28 MED ORDER — VITAMIN A 10000 UNITS PO CAPS
10000.0000 [IU] | ORAL_CAPSULE | Freq: Every day | ORAL | Status: DC
  Administered 2018-03-29 – 2018-03-31 (×3): 10000 [IU] via ORAL
  Filled 2018-03-28 (×3): qty 1

## 2018-03-28 MED ORDER — SODIUM CHLORIDE 0.9 % IV BOLUS
1000.0000 mL | Freq: Once | INTRAVENOUS | Status: AC
  Administered 2018-03-28: 1000 mL via INTRAVENOUS

## 2018-03-28 MED ORDER — SODIUM CHLORIDE 0.9 % IV SOLN
1.0000 g | INTRAVENOUS | Status: DC
  Administered 2018-03-29 – 2018-03-30 (×3): 1 g via INTRAVENOUS
  Filled 2018-03-28 (×3): qty 1

## 2018-03-28 MED ORDER — BENZTROPINE MESYLATE 0.5 MG PO TABS
0.2500 mg | ORAL_TABLET | Freq: Two times a day (BID) | ORAL | Status: DC
  Filled 2018-03-28: qty 1

## 2018-03-28 MED ORDER — AMLODIPINE BESYLATE 5 MG PO TABS
5.0000 mg | ORAL_TABLET | Freq: Every day | ORAL | Status: DC
  Administered 2018-03-29 – 2018-03-30 (×2): 5 mg via ORAL
  Filled 2018-03-28 (×2): qty 1

## 2018-03-28 MED ORDER — VITAMIN D3 25 MCG (1000 UNIT) PO TABS
1000.0000 [IU] | ORAL_TABLET | Freq: Every day | ORAL | Status: DC
  Administered 2018-03-29 – 2018-03-31 (×3): 1000 [IU] via ORAL
  Filled 2018-03-28 (×3): qty 1

## 2018-03-28 MED ORDER — VITAMIN B-12 100 MCG PO TABS
100.0000 ug | ORAL_TABLET | Freq: Every day | ORAL | Status: DC
  Administered 2018-03-29 – 2018-03-31 (×3): 100 ug via ORAL
  Filled 2018-03-28 (×3): qty 1

## 2018-03-28 NOTE — ED Provider Notes (Signed)
[facility] Provider Note   CSN: [id number] Arrival date & time: 03/28/18  1835     History   Chief Complaint Chief Complaint  Patient presents with  . Emesis  . Diarrhea    HPI Emma [name] is a 84 y.o. female.  HPI   Pt is an 84 y/o female with a h/o dementia, HTN, CAD, RA who presents to the ED c/o nausea and vomiting that began 2 days ago. Pt states she has had 3 episodes of vomiting. Denies hematemesis. She also reports diarrhea for the same time period. Estimates about 3 episodes of diarrhea. She is unsure if she has had blood in her stool.   Pt initially denies chest pain. On further questioning she reports has a dull, "light pain" to the middle of her chest that she estimates began around 3:00pm. States pain is nonradiating.  She also reports sweats and chills, but denies shortness of breath, cough, or abd pain. Pt lives at [facility].   7:20 PM Spoke with pts daughter on the phone who states that pt started having NVD 2 days ago. Daughter also noted that pts speech seemed slow and that she seemed confused over the last 2 days. Denies that speech was slurred.  Pts daughter states that she has seen worms in pts stool a year ago and is concerned for parasites. She does not believe that pt f/u'ed about this over the last year. Further states that pt is normally oriented to day of the week, not necessarily to date. states that norovirus is going around assisted living facility.   7:26 PM Spoke with Arnetha Courser from [facility]. States that pt has had NVD, stating that she is not feeling well, seems more confused than normal. Was given 2 imodium for diarrhea. Had fever 102F.   Past Medical History:  Diagnosis Date  . Coronary artery disease   . Dementia   . Hypertension   . RA (rheumatoid arthritis) [facility])     Patient Active Problem List   Diagnosis Date Noted  . Nausea vomiting and diarrhea 03/28/2018  . Infiltrate of lower  lobe of left lung present on imaging study 03/28/2018  . Dementia 03/28/2018  . HTN (hypertension) 03/28/2018  . AKI (acute kidney injury) (HCC) 03/28/2018  . Paranoid [facility])     Past Surgical History:  Procedure Laterality Date  . stents       OB History   None      Home Medications    Prior to Admission medications   Medication Sig Start Date End Date Taking? Authorizing Provider  amLODipine (NORVASC) 5 MG tablet Take 5 mg by mouth daily. 11/05/17  Yes [provider]  benztropine (COGENTIN) 0.5 MG tablet Take 0.5 tablets by mouth 2 (two) times daily. 11/08/17  Yes [provider]  cholecalciferol (VITAMIN D) 1000 units tablet Take 1,000 Units by mouth daily.   Yes [provider]  hydrochlorothiazide (MICROZIDE) 12.5 MG capsule Take 12.5 capsules by mouth daily. 11/08/17  Yes [provider]  lisinopril (PRINIVIL,ZESTRIL) 20 MG tablet Take 20 mg by mouth daily. 11/08/17  Yes [provider]  risperiDONE (RISPERDAL) 1 MG tablet Take 1 mg by mouth daily. 11/05/17  Yes [provider]  risperiDONE (RISPERDAL) 2 MG tablet Take 2 mg by mouth daily. At bedtime 11/08/17  Yes [provider]  TRAVATAN Z 0.004 % SOLN ophthalmic solution Place 1 drop into both eyes at bedtime. 08/12/17  Yes [provider]  vitamin A 52778 UNIT capsule Take 10,000 Units by mouth daily.   Yes [provider]  vitamin B-12 (CYANOCOBALAMIN) 100 MCG tablet Take 100 mcg by mouth daily. 11/05/17  Yes [provider]  vitamin C (ASCORBIC ACID) 500 MG tablet Take 500 mg by mouth daily.   Yes [provider]  traZODone (DESYREL) 50 MG tablet Take 50 mg by mouth at bedtime as needed for sleep. 11/08/17   [provider]    Family History No family history on file.  Social History Social History   Tobacco Use  . Smoking status: Never Smoker  . Smokeless tobacco: Never Used  Substance Use Topics  .  Alcohol use: No  . Drug use: Not on file     Allergies   Patient has no known allergies.   Review of Systems Review of Systems  Unable to perform ROS: Dementia  Constitutional: Positive for chills and diaphoresis.  HENT: Positive for sore throat. Negative for congestion.   Eyes: Negative for visual disturbance.  Respiratory: Negative for cough and shortness of breath.   Cardiovascular: Positive for chest pain. Negative for leg swelling.  Gastrointestinal: Positive for diarrhea, nausea and vomiting. Negative for abdominal distention and constipation.  Genitourinary: Negative for flank pain, frequency and hematuria.  Musculoskeletal: Negative for back pain and neck pain.  Skin: Negative for rash.  Neurological: Negative for dizziness, weakness, light-headedness, numbness and headaches.     Physical Exam Updated Vital Signs BP (!) 132/55 (BP Location: Left Arm)   Pulse 82   Temp 98.2 F (36.8 C) (Oral)   Resp (!) 22   Ht 5\' 10"  (1.778 m)   Wt 75.2 kg (165 lb 12.6 oz)   SpO2 96%   BMI 23.79 kg/m   Physical Exam  Constitutional: She appears well-developed and well-nourished. No distress.  HENT:  Head: Normocephalic and atraumatic.  Right Ear: External ear normal.  Left Ear: External ear normal.  Mouth/Throat: Oropharynx is clear and moist.  Eyes: Pupils are equal, round, and reactive to light. Conjunctivae and EOM are normal.  Neck: Normal range of motion. Neck supple.  Cardiovascular: Normal rate, regular rhythm, normal heart sounds and intact distal pulses.  No murmur heard. Pulmonary/Chest: Effort normal and breath sounds normal. No stridor. No respiratory distress. She has no wheezes. She has no rales.  Abdominal: Soft. Bowel sounds are normal. She exhibits no distension and no mass. There is no guarding.  Epigastric and LUQ abd ttp  Musculoskeletal: She exhibits no edema.  Neurological: She is alert.  Mental Status:  Alert, thought content appropriate, able to  give a coherent history. Speech fluent without evidence of aphasia. Able to follow 2 step commands without difficulty.  Cranial Nerves:  II: pupils equal, round, reactive to light III,IV, VI: ptosis not present, extra-ocular motions intact bilaterally  V,VII: smile symmetric, facial light touch sensation equal VIII: hearing grossly normal to voice  X: uvula elevates symmetrically  XI: bilateral shoulder shrug symmetric and strong XII: midline tongue extension without fassiculations Motor:  Normal tone. 5/5 strength of BUE and BLE major muscle groups including strong and equal grip strength and dorsiflexion/plantar flexion Sensory: light touch normal in all extremities. CV: 2+ radial and DP/PT pulses Pt oriented to self, place, and situation, but not date. Knows day of the week (baseline per daughter)  Skin: Skin is warm and dry. Capillary refill takes less than 2 seconds.  Psychiatric:  demented  Nursing note and vitals reviewed.  ED Treatments / Results  Labs (all labs ordered are listed, but only abnormal results are displayed) Labs Reviewed  COMPREHENSIVE METABOLIC PANEL - Abnormal; Notable for the following components:      Result Value   Potassium 3.2 (*)    CO2 21 (*)    Glucose, Bld 129 (*)    BUN 30 (*)    Creatinine, Ser 1.54 (*)    GFR calc non Af Amer 30 (*)    GFR calc Af Amer 35 (*)    All other components within normal limits  URINE CULTURE  GASTROINTESTINAL PANEL BY PCR, STOOL (REPLACES STOOL CULTURE)  CULTURE, BLOOD (ROUTINE X 2)  CULTURE, BLOOD (ROUTINE X 2)  CULTURE, EXPECTORATED SPUTUM-ASSESSMENT  GRAM STAIN  LIPASE, BLOOD  CBC  TROPONIN I  URINALYSIS, ROUTINE W REFLEX MICROSCOPIC  HIV ANTIBODY (ROUTINE TESTING)  STREP PNEUMONIAE URINARY ANTIGEN    EKG None  Radiology Dg Chest 2 View  Result Date: 03/28/2018 CLINICAL DATA:  Chest pain EXAM: CHEST - 2 VIEW COMPARISON:  03/24/2018 FINDINGS: Left lower lobe infiltrate with probable small left  effusion. Right lung is clear. Normal heart size. No pneumothorax. IMPRESSION: Left lower lobe infiltrate with probable adjacent small effusion. Radiographic follow-up to resolution recommended. Electronically Signed   By: Jasmine Pang M.D.   On: 03/28/2018 20:06   Ct Head Wo Contrast  Result Date: 03/28/2018 CLINICAL DATA:  Altered mental status EXAM: CT HEAD WITHOUT CONTRAST TECHNIQUE: Contiguous axial images were obtained from the base of the skull through the vertex without intravenous contrast. COMPARISON:  03/24/2018 FINDINGS: Brain: No evidence of acute infarction, hemorrhage, hydrocephalus, extra-axial collection or mass lesion/mass effect. Mild small vessel ischemic changes of the white matter and atrophy. Vascular: Carotid vascular calcification.  No hyperdense vessel Skull: No acute abnormality Sinuses/Orbits: Mucosal thickening in the ethmoid and sphenoid sinuses. No acute orbital abnormality. Other: None IMPRESSION: 1. No CT evidence for acute intracranial abnormality. 2. Atrophy and mild small vessel ischemic changes of the white matter Electronically Signed   By: Jasmine Pang M.D.   On: 03/28/2018 20:11    Procedures Procedures (including critical care time)  Medications Ordered in ED Medications  pantoprazole (PROTONIX) EC tablet 20 mg (0 mg Oral Hold 03/28/18 2103)  cefTRIAXone (ROCEPHIN) 1 g in sodium chloride 0.9 % 100 mL IVPB (1 g Intravenous New Bag/Given 03/29/18 0108)  azithromycin (ZITHROMAX) 500 mg in sodium chloride 0.9 % 250 mL IVPB (500 mg Intravenous New Bag/Given 03/28/18 2354)  0.9 %  sodium chloride infusion ( Intravenous New Bag/Given 03/28/18 2224)  amLODipine (NORVASC) tablet 5 mg (has no administration in time range)  cholecalciferol (VITAMIN D) tablet 1,000 Units (has no administration in time range)  risperiDONE (RISPERDAL) tablet 1 mg (has no administration in time range)  risperiDONE (RISPERDAL) tablet 2 mg (2 mg Oral Given 03/28/18 2351)  latanoprost (XALATAN)  0.005 % ophthalmic solution 1 drop (1 drop Both Eyes Given 03/28/18 2354)  traZODone (DESYREL) tablet 50 mg (has no administration in time range)  vitamin A capsule 10,000 Units (has no administration in time range)  vitamin B-12 (CYANOCOBALAMIN) tablet 100 mcg (has no administration in time range)  enoxaparin (LOVENOX) injection 30 mg (30 mg Subcutaneous Given 03/28/18 2351)  benztropine (COGENTIN) tablet 0.25 mg (0.25 mg Oral Given 03/28/18 2351)  sodium chloride 0.9 % bolus 1,000 mL (1,000 mLs Intravenous New Bag/Given 03/28/18 2031)  potassium chloride SA (K-DUR,KLOR-CON) CR tablet 40 mEq (40 mEq Oral Given 03/28/18 2032)  Initial Impression / Assessment and Plan / ED Course  I have reviewed the triage vital signs and the nursing notes.  Pertinent labs & imaging results that were available during my care of the patient were reviewed by me and considered in my medical decision making (see chart for details).  Discussed pt presentation and exam findings with Dr. Rodena Medin, who agrees with the plan to for admission for pneumonia and gastroenteritis.  Discussed pt with Dr. Julian Reil who will admit the pt into his care.   Final Clinical Impressions(s) / ED Diagnoses   Final diagnoses:  Healthcare-associated pneumonia  Gastroenteritis   84 year old female with nausea vomiting diarrhea, abdominal pain, and some confusion over the last several days.  Also with fevers.  Vital signs stable here, afebrile.   Pt has non-surgical abdomen on exam.   CBC with no leukocytosis.    CMP with elevated creatinine 1.54 and hypokalemia to 3.2.  Supplemented with 40 mEq K-Dur.  Elevated creatinine likely due to dehydration.  Patient given fluids in the ED. UA and urine culture pending, however patient asymptomatic for UTI symptoms.  No focal neurologic deficits on exam, CT head negative for any acute changes.  Doubt CVA or other acute etiology.  Suspect that confusion likely secondary to patient's recent illness.   Other than being somewhat slow to respond, daughter and nursing home state that patient has been a baseline overall.  Chest x-ray with left lower lobe infiltrate and likely pleural effusion.  Will treat for healthcare associated pneumonia given she currently resides in assisted living facility.  ECG with normal sinus rhythm and heart rate 81.  Has PACs as well as likely right atrial enlargement.  Left ventricular hypertrophy.  Do not observe any ST elevations or depressions consistent with ischemia. Troponin negative. Do not suspect ACS.  Pt admitted to hospitalist service for further tx of HCAP and gastroenteritis. She is stable and afebrile in ED.   ED Discharge Orders    None       Rayne Du 03/29/18 0131    Wynetta Fines, MD 03/29/18 [phone]

## 2018-03-28 NOTE — ED Notes (Signed)
ED TO INPATIENT HANDOFF REPORT  Name/Age/Gender Emma [name] 84 y.o. female  Code Status    Code Status Orders  (From admission, onward)        Start     Ordered   03/28/18 2039  Full code  Continuous     03/28/18 2039    Code Status History    Date Active Date Inactive Code Status Order ID Comments User Context   10/15/2017 1800 10/18/2017 1839 Full Code [id number]  Tanna Furry, MD ED   10/15/2017 0644 10/15/2017 1800 Full Code [id number]  Varney Biles, MD ED      Home/SNF/Other Skilled nursing facility  Chief Complaint n v d  Level of Care/Admitting Diagnosis ED Disposition    ED Disposition Condition [place]: [facility] [[id number]]  Level of Care: Med-Surg [16]  Diagnosis: Nausea vomiting and diarrhea [364680]  Admitting Physician: Etta Quill [[id number]]  Attending Physician: Etta Quill [[id number]]  Estimated length of stay: past midnight tomorrow  Certification:: I certify this patient will need inpatient services for at least 2 midnights  PT Class (Do Not Modify): Inpatient [101]  PT Acc Code (Do Not Modify): Private [1]       Medical History Past Medical History:  Diagnosis Date  . Coronary artery disease   . Dementia   . Hypertension   . RA (rheumatoid arthritis) (HCC)     Allergies No Known Allergies  IV Location/Drains/Wounds Patient Lines/Drains/Airways Status   Active Line/Drains/Airways    Name:   Placement date:   Placement time:   Site:   Days:   Peripheral IV 03/28/18 Right Antecubital   03/28/18    1854    Antecubital   less than 1          Labs/Imaging Results for orders placed or performed during the hospital encounter of 03/28/18 (from the past 48 hour(s))  Lipase, blood     Status: None   Collection Time: 03/28/18  6:53 PM  Result Value Ref Range   Lipase 23 11 - 51 U/L    Comment: Performed at [facility], [place] [address] [place], [place] [address]   Comprehensive metabolic panel     Status: Abnormal   Collection Time: 03/28/18  6:53 PM  Result Value Ref Range   Sodium 138 135 - 145 mmol/L   Potassium 3.2 (L) 3.5 - 5.1 mmol/L   Chloride 105 101 - 111 mmol/L   CO2 21 (L) 22 - 32 mmol/L   Glucose, Bld 129 (H) 65 - 99 mg/dL   BUN 30 (H) 6 - 20 mg/dL   Creatinine, Ser 1.54 (H) 0.44 - 1.00 mg/dL   Calcium 9.7 8.9 - 10.3 mg/dL   Total Protein 8.1 6.5 - 8.1 g/dL   Albumin 4.4 3.5 - 5.0 g/dL   AST 19 15 - 41 U/L   ALT 18 14 - 54 U/L   Alkaline Phosphatase 50 38 - 126 U/L   Total Bilirubin 1.1 0.3 - 1.2 mg/dL   GFR calc non Af Amer 30 (L) >60 mL/min   GFR calc Af Amer 35 (L) >60 mL/min    Comment: (NOTE) The eGFR has been calculated using the CKD EPI equation. This calculation has not been validated in all clinical situations. eGFR's persistently <60 mL/min signify possible Chronic Kidney Disease.    Anion gap 12 5 - 15    Comment: Performed at [facility], [address] [place],  [place] [address]  CBC     Status: None   Collection Time: 03/28/18  6:53 PM  Result Value Ref Range   WBC 7.7 4.0 - 10.5 K/uL   RBC 4.46 3.87 - 5.11 MIL/uL   Hemoglobin 14.0 12.0 - 15.0 g/dL   HCT 41.0 36.0 - 46.0 %   MCV 91.9 78.0 - 100.0 fL   MCH 31.4 26.0 - 34.0 pg   MCHC 34.1 30.0 - 36.0 g/dL   RDW 13.3 11.5 - 15.5 %   Platelets 176 150 - 400 K/uL    Comment: Performed at [facility], [place] [address] [place], [place] [address]  Troponin I     Status: None   Collection Time: 03/28/18  6:53 PM  Result Value Ref Range   Troponin I <0.03 <0.03 ng/mL    Comment: Performed at [facility], [place] [address] [place], [place] [address]   Dg Chest 2 View  Result Date: 03/28/2018 CLINICAL DATA:  Chest pain EXAM: CHEST - 2 VIEW COMPARISON:  03/24/2018 FINDINGS: Left lower lobe infiltrate with probable small left effusion. Right lung is clear. Normal heart size. No pneumothorax. IMPRESSION: Left  lower lobe infiltrate with probable adjacent small effusion. Radiographic follow-up to resolution recommended. Electronically Signed   By: Donavan Foil M.D.   On: 03/28/2018 20:06   Ct Head Wo Contrast  Result Date: 03/28/2018 CLINICAL DATA:  Altered mental status EXAM: CT HEAD WITHOUT CONTRAST TECHNIQUE: Contiguous axial images were obtained from the base of the skull through the vertex without intravenous contrast. COMPARISON:  03/24/2018 FINDINGS: Brain: No evidence of acute infarction, hemorrhage, hydrocephalus, extra-axial collection or mass lesion/mass effect. Mild small vessel ischemic changes of the white matter and atrophy. Vascular: Carotid vascular calcification.  No hyperdense vessel Skull: No acute abnormality Sinuses/Orbits: Mucosal thickening in the ethmoid and sphenoid sinuses. No acute orbital abnormality. Other: None IMPRESSION: 1. No CT evidence for acute intracranial abnormality. 2. Atrophy and mild small vessel ischemic changes of the white matter Electronically Signed   By: Donavan Foil M.D.   On: 03/28/2018 20:11    Pending Labs Unresulted Labs (From admission, onward)   Start     Ordered   03/28/18 2039  Culture, blood (routine x 2) Call MD if unable to obtain prior to antibiotics being given  BLOOD CULTURE X 2,   R    Comments:  If blood cultures drawn in Emergency Department - Do not draw and cancel order    03/28/18 2039   03/28/18 2039  Culture, sputum-assessment  Once,   R     03/28/18 2039   03/28/18 2039  Gram stain  Once,   R     03/28/18 2039   03/28/18 2039  HIV antibody (Routine Screening)  Once,   R     03/28/18 2039   03/28/18 2039  Strep pneumoniae urinary antigen  Once,   R     03/28/18 2039   03/28/18 2038  Gastrointestinal Panel by PCR , Stool  (Gastrointestinal Panel by PCR, Stool)  Once,   R     03/28/18 2037   03/28/18 1917  Urine culture  STAT,   STAT     03/28/18 1916   03/28/18 1916  Urinalysis, Routine w reflex microscopic  Once,   R      03/28/18 1916      Vitals/Pain Today's Vitals   03/28/18 1840 03/28/18 1849 03/28/18 1902 03/28/18 2029  BP:   130/61 116/60  Pulse:   86 72  Resp:   15 20  Temp:  98.7 F (37.1 C)    TempSrc:  Oral    SpO2:   100% 99%  PainSc: 4    Asleep    Isolation Precautions Enteric precautions (UV disinfection)  Medications Medications  pantoprazole (PROTONIX) EC tablet 20 mg (0 mg Oral Hold 03/28/18 2103)  enoxaparin (LOVENOX) injection 40 mg (has no administration in time range)  cefTRIAXone (ROCEPHIN) 1 g in sodium chloride 0.9 % 100 mL IVPB (has no administration in time range)  azithromycin (ZITHROMAX) 500 mg in sodium chloride 0.9 % 250 mL IVPB (has no administration in time range)  0.9 %  sodium chloride infusion (has no administration in time range)  sodium chloride 0.9 % bolus 1,000 mL (1,000 mLs Intravenous New Bag/Given 03/28/18 2031)  potassium chloride SA (K-DUR,KLOR-CON) CR tablet 40 mEq (40 mEq Oral Given 03/28/18 2032)    Mobility walks with device

## 2018-03-28 NOTE — ED Notes (Signed)
Bed: RESB Expected date:  Expected time:  Means of arrival:  Comments: 84 yo

## 2018-03-28 NOTE — ED Triage Notes (Signed)
Per EMS pt complaint of n/v/d onset today.

## 2018-03-28 NOTE — H&P (Addendum)
History and Physical    Emma [name] WCH:[id number] DOB: March 22, 1932 DOA: 03/28/2018  PCP: Diamantina Providence, FNP  Patient coming from: SNF  I have personally briefly reviewed patient's old medical records in [facility]  Chief Complaint: N/V/D  HPI: Emma [name] is a 84 y.o. female with medical history significant of Alzheimer's dementia, HTN, RA, CAD.  Patient presents to the ED with c/o nausea and vomiting that began 2 days ago. Pt states she has had 3 episodes of vomiting. Denies hematemesis. She also reports diarrhea for the same time period. Estimates about 3 episodes of diarrhea. She is unsure if she has had blood in her stool.   Pt initially denies chest pain. On further questioning she reports has a dull, "light pain" to the middle of her chest that she estimates began around 3:00pm. States pain is nonradiating.  She also reports sweats and chills, but denies shortness of breath, cough, or abd pain. Pt lives at [facility].   Per patients daughter: patient started having N/V/D x2 days ago.  Speech seemed slow and patient confused over past 2 days.  Denies slurred speech.  Pts daughter states that she has seen worms in pts stool a year ago and is concerned. Normally oriented to day of the week, not necessarily to date. Daughter states that norovirus is going around assisted living facility.  Per facility: N/V/D, given 2 imodium for diarrhea, fever 102.   ED Course: Labs show AKI.  UA neg.  Given 1L NS, zofran.  CXR shows LLL infiltrate.  Hospitalist asked to admit.   Review of Systems: 12 systems reviewed and otherwise negative.  Past Medical History:  Diagnosis Date  . Coronary artery disease   . Dementia   . Hypertension   . RA (rheumatoid arthritis) (HCC)     Past Surgical History:  Procedure Laterality Date  . stents       reports that she has never smoked. She has never used smokeless tobacco. She reports that she does not drink alcohol. Her drug history is  not on file.  No Known Allergies  No family history on file. Patient unable to provide due to dementia.  Prior to Admission medications   Medication Sig Start Date End Date Taking? Authorizing Provider  amLODipine (NORVASC) 5 MG tablet Take 5 mg by mouth daily. 11/05/17   [provider]  benztropine (COGENTIN) 0.5 MG tablet Take 0.5 tablets by mouth 2 (two) times daily. 11/08/17   [provider]  cholecalciferol (VITAMIN D) 1000 units tablet Take 1,000 Units by mouth daily.    [provider]  hydrochlorothiazide (MICROZIDE) 12.5 MG capsule Take 12.5 capsules by mouth daily. 11/08/17   [provider]  ibuprofen (ADVIL,MOTRIN) 400 MG tablet Take 1 tablet (400 mg total) by mouth every 6 (six) hours as needed. Patient not taking: Reported on 11/16/2017 01/29/14   Marlon Pel, PA-C  lisinopril (PRINIVIL,ZESTRIL) 20 MG tablet Take 20 mg by mouth daily. 11/08/17   [provider]  lisinopril-hydrochlorothiazide (PRINZIDE,ZESTORETIC) 10-12.5 MG per tablet Take 1 tablet by mouth daily. Patient not taking: Reported on 10/04/2017 05/15/15   Hayden Rasmussen, NP  risperiDONE (RISPERDAL) 1 MG tablet Take 1 mg by mouth daily. 11/05/17   [provider]  risperiDONE (RISPERDAL) 2 MG tablet Take 2 mg by mouth daily. At bedtime 11/08/17   [provider]  traMADol (ULTRAM) 50 MG tablet Take 1 tablet (50 mg total) by mouth every 6 (six) hours as needed. Patient  not taking: Reported on 03/24/2018 09/16/17   Rolan Bucco, MD  TRAVATAN Z 0.004 % SOLN ophthalmic solution Place 1 drop into both eyes at bedtime. 08/12/17   [provider]  traZODone (DESYREL) 50 MG tablet Take 50 mg by mouth at bedtime as needed for sleep. 11/08/17   [provider]  vitamin A 84132 UNIT capsule Take 10,000 Units by mouth daily.    [provider]  vitamin B-12 (CYANOCOBALAMIN) 100 MCG tablet Take 100 mcg by mouth daily. 11/05/17   [provider]  vitamin C (ASCORBIC ACID) 500 MG tablet Take 500 mg by mouth daily.    [provider]    Physical Exam: Vitals:   03/28/18 1839 03/28/18 1849 03/28/18 1902 03/28/18 2029  BP:   130/61 116/60  Pulse:   86 72  Resp:   15 20  Temp:  98.7 F (37.1 C)    TempSrc:  Oral    SpO2: 98%  100% 99%    Constitutional: NAD, calm, comfortable Eyes: PERRL, lids and conjunctivae normal ENMT: Mucous membranes are moist. Posterior pharynx clear of any exudate or lesions.Normal dentition.  Neck: normal, supple, no masses, no thyromegaly Respiratory: clear to auscultation bilaterally, no wheezing, no crackles. Normal respiratory effort. No accessory muscle use.  Cardiovascular: Regular rate and rhythm, no murmurs / rubs / gallops. No extremity edema. 2+ pedal pulses. No carotid bruits.  Abdomen: no tenderness, no masses palpated. No hepatosplenomegaly. Bowel sounds positive.  Musculoskeletal: no clubbing / cyanosis. No joint deformity upper and lower extremities. Good ROM, no contractures. Normal muscle tone.  Skin: no rashes, lesions, ulcers. No induration Neurologic: CN 2-12 grossly intact. Sensation intact, DTR normal. Strength 5/5 in all 4.  Psychiatric: Demented, slow to respond.   Labs on Admission: I have personally reviewed following labs and imaging studies  CBC: Recent Labs  Lab 03/24/18 1756 03/28/18 1853  WBC 6.7 7.7  NEUTROABS 3.3  --   HGB 12.9 14.0  HCT 38.9 41.0  MCV 92.6 91.9  PLT 211 176   Basic Metabolic Panel: Recent Labs  Lab 03/24/18 1756 03/28/18 1853  NA 136 138  K 4.0 3.2*  CL 102 105  CO2 25 21*  GLUCOSE 104* 129*  BUN 8 30*  CREATININE 0.74 1.54*  CALCIUM 10.0 9.7   GFR: Estimated Creatinine Clearance: 29.9 mL/min (A) (by C-G formula based on SCr of 1.54 mg/dL (H)). Liver Function Tests: Recent Labs  Lab 03/24/18 1756 03/28/18 1853  AST 15 19  ALT 14 18  ALKPHOS 50 50  BILITOT 0.5 1.1  PROT 7.2 8.1  ALBUMIN 4.1 4.4     Recent Labs  Lab 03/28/18 1853  LIPASE 23   No results for input(s): AMMONIA in the last 168 hours. Coagulation Profile: No results for input(s): INR, PROTIME in the last 168 hours. Cardiac Enzymes: Recent Labs  Lab 03/28/18 1853  TROPONINI <0.03   BNP (last 3 results) No results for input(s): PROBNP in the last 8760 hours. HbA1C: No results for input(s): HGBA1C in the last 72 hours. CBG: No results for input(s): GLUCAP in the last 168 hours. Lipid Profile: No results for input(s): CHOL, HDL, LDLCALC, TRIG, CHOLHDL, LDLDIRECT in the last 72 hours. Thyroid Function Tests: No results for input(s): TSH, T4TOTAL, FREET4, T3FREE, THYROIDAB in the last 72 hours. Anemia Panel: No results for input(s): VITAMINB12, FOLATE, FERRITIN, TIBC, IRON, RETICCTPCT in the last 72 hours. Urine analysis:    Component Value Date/Time   COLORURINE STRAW (  A) 03/24/2018 2242   APPEARANCEUR CLEAR 03/24/2018 2242   LABSPEC 1.004 (L) 03/24/2018 2242   PHURINE 6.0 03/24/2018 2242   GLUCOSEU NEGATIVE 03/24/2018 2242   HGBUR NEGATIVE 03/24/2018 2242   BILIRUBINUR NEGATIVE 03/24/2018 2242   KETONESUR NEGATIVE 03/24/2018 2242   PROTEINUR NEGATIVE 03/24/2018 2242   NITRITE NEGATIVE 03/24/2018 2242   LEUKOCYTESUR NEGATIVE 03/24/2018 2242    Radiological Exams on Admission: Dg Chest 2 View  Result Date: 03/28/2018 CLINICAL DATA:  Chest pain EXAM: CHEST - 2 VIEW COMPARISON:  03/24/2018 FINDINGS: Left lower lobe infiltrate with probable small left effusion. Right lung is clear. Normal heart size. No pneumothorax. IMPRESSION: Left lower lobe infiltrate with probable adjacent small effusion. Radiographic follow-up to resolution recommended. Electronically Signed   By: Jasmine Pang M.D.   On: 03/28/2018 20:06   Ct Head Wo Contrast  Result Date: 03/28/2018 CLINICAL DATA:  Altered mental status EXAM: CT HEAD WITHOUT CONTRAST TECHNIQUE: Contiguous axial images were obtained from the base of the skull through  the vertex without intravenous contrast. COMPARISON:  03/24/2018 FINDINGS: Brain: No evidence of acute infarction, hemorrhage, hydrocephalus, extra-axial collection or mass lesion/mass effect. Mild small vessel ischemic changes of the white matter and atrophy. Vascular: Carotid vascular calcification.  No hyperdense vessel Skull: No acute abnormality Sinuses/Orbits: Mucosal thickening in the ethmoid and sphenoid sinuses. No acute orbital abnormality. Other: None IMPRESSION: 1. No CT evidence for acute intracranial abnormality. 2. Atrophy and mild small vessel ischemic changes of the white matter Electronically Signed   By: Jasmine Pang M.D.   On: 03/28/2018 20:11    EKG: Independently reviewed.  Assessment/Plan Principal Problem:   Nausea vomiting and diarrhea Active Problems:   Infiltrate of lower lobe of left lung present on imaging study   Dementia   HTN (hypertension)   AKI (acute kidney injury) (HCC)    1. N/V/D - suspect norovirus given HPI 1. GI pathogen panel 1. If neg, then obtain ova and parasites given the history of "worms in stool a year ago"; however, I have heard about an outbreak of norovirus in living facilities in the area in past couple of weeks, and daughter is giving an unprompted history of "norovirus going around the patients facility". 2. IVF 2. LLL infiltrate - 1. Will put on CAP treatment for now 2. No hypoxia nor PNA symptoms though 3. AKI - likely pre-renal 1. IVF: NS 1L bolus in ED then 125 cc/hr 2. Repeat BMP in AM 4. HTN - 1. Holding home ACEi, and HCTZ due to AKI 2. Continue amlodipine 5. Dementia - 1. likely acute delirium on top of chronic dementia 2. Resume home meds  DVT prophylaxis: Lovenox Code Status: Full Family Communication: No family in room Disposition Plan: Home after admit Consults called: None Admission status: Admit to inpatient   Hillary Bow DO [facility] Pager [phone]  If 7AM-7PM, please contact day team  taking care of patient [email or web address] Password [facility]  03/28/2018, 8:53 PM

## 2018-03-29 ENCOUNTER — Other Ambulatory Visit: Payer: Self-pay

## 2018-03-29 LAB — URINALYSIS, ROUTINE W REFLEX MICROSCOPIC
Bilirubin Urine: NEGATIVE
Glucose, UA: NEGATIVE mg/dL
Hgb urine dipstick: NEGATIVE
Ketones, ur: NEGATIVE mg/dL
LEUKOCYTES UA: NEGATIVE
NITRITE: NEGATIVE
Protein, ur: NEGATIVE mg/dL
SPECIFIC GRAVITY, URINE: 1.013 (ref 1.005–1.030)
pH: 5 (ref 5.0–8.0)

## 2018-03-29 LAB — HIV ANTIBODY (ROUTINE TESTING W REFLEX): HIV Screen 4th Generation wRfx: NONREACTIVE

## 2018-03-29 LAB — MRSA PCR SCREENING: MRSA by PCR: NEGATIVE

## 2018-03-29 LAB — STREP PNEUMONIAE URINARY ANTIGEN: Strep Pneumo Urinary Antigen: NEGATIVE

## 2018-03-29 MED ORDER — ORAL CARE MOUTH RINSE
15.0000 mL | Freq: Two times a day (BID) | OROMUCOSAL | Status: DC
  Administered 2018-03-29: 15 mL via OROMUCOSAL

## 2018-03-29 MED ORDER — POTASSIUM CHLORIDE CRYS ER 10 MEQ PO TBCR
40.0000 meq | EXTENDED_RELEASE_TABLET | Freq: Two times a day (BID) | ORAL | Status: AC
  Administered 2018-03-29 – 2018-03-30 (×2): 40 meq via ORAL
  Filled 2018-03-29 (×2): qty 4

## 2018-03-29 MED ORDER — ACETAMINOPHEN 325 MG PO TABS
650.0000 mg | ORAL_TABLET | Freq: Four times a day (QID) | ORAL | Status: DC | PRN
Start: 2018-03-29 — End: 2018-03-31

## 2018-03-29 MED ORDER — CHLORHEXIDINE GLUCONATE 0.12 % MT SOLN
15.0000 mL | Freq: Two times a day (BID) | OROMUCOSAL | Status: DC
  Administered 2018-03-29 – 2018-03-31 (×5): 15 mL via OROMUCOSAL
  Filled 2018-03-29 (×5): qty 15

## 2018-03-29 MED ORDER — BOOST / RESOURCE BREEZE PO LIQD CUSTOM
1.0000 | Freq: Two times a day (BID) | ORAL | Status: DC
  Administered 2018-03-29 – 2018-03-31 (×3): 1 via ORAL

## 2018-03-29 NOTE — Progress Notes (Signed)
Initial Nutrition Assessment  DOCUMENTATION CODES:   Not applicable  INTERVENTION:   Boost Breeze po BID, each supplement provides 250 kcal and 9 grams of protein  NUTRITION DIAGNOSIS:   Inadequate oral intake related to vomiting, nausea as evidenced by per patient/family report.  GOAL:   Patient will meet greater than or equal to 90% of their needs   MONITOR:   PO intake, Supplement acceptance, Diet advancement, Weight trends, Labs  REASON FOR ASSESSMENT:   Malnutrition Screening Tool   ASSESSMENT:   Patient with PMH significant for Alzheimer's dementia, HTN, RA, and CAD. Presents this admission with complaints of vomiting and diarrhea. Admitted for suspected Norovirus and left lower lobe infiltrate.    Attempted to speak with pt at bedside. Reports appetite decreased 1 day PTA. She typically eats 2 meals each day but unable to provide any further detail. Pt claims she does not drink supplements at the assisted living facility, but is amendable this stay. Pt consumed 75% of cranberry juice and bites of her broth. RD to order supplementation to maximize protein while on clear liquid diet.   Pt unsure of UBW. Records are limited in weight history showing a stated weight of 175 lb 03/24/18 and an actual weight of 165 lb this stay. Hard to determine weight loss at this time given lack of evidence. Nutrition-Focused physical exam completed.   Medications reviewed and include: Vit D, Vit A, Vit B-12, NS @ 125 ml/hr, IV abx Labs reviewed: K 3.2 (L) Creatinine 1.54 (H)  NUTRITION - FOCUSED PHYSICAL EXAM:    Most Recent Value  Orbital Region  No depletion  Upper Arm Region  No depletion  Thoracic and Lumbar Region  Unable to assess  Buccal Region  No depletion  Temple Region  Mild depletion  Clavicle Bone Region  Moderate depletion  Clavicle and Acromion Bone Region  No depletion  Scapular Bone Region  Unable to assess  Dorsal Hand  No depletion  Patellar Region  No depletion   Anterior Thigh Region  No depletion  Posterior Calf Region  No depletion  Edema (RD Assessment)  None  Hair  Reviewed  Eyes  Reviewed  Mouth  Reviewed  Skin  Reviewed  Nails  Reviewed     Diet Order:  Diet clear liquid Room service appropriate? Yes; Fluid consistency: Thin  EDUCATION NEEDS:   Not appropriate for education at this time  Skin:  Skin Assessment: Reviewed RN Assessment  Last BM:  03/28/18  Height:   Ht Readings from Last 1 Encounters:  03/28/18 5\' 10"  (1.778 m)    Weight:   Wt Readings from Last 1 Encounters:  03/28/18 165 lb 12.6 oz (75.2 kg)    Ideal Body Weight:  68.2 kg  BMI:  Body mass index is 23.79 kg/m.  Estimated Nutritional Needs:   Kcal:  1850-2050 kcal  Protein:  80-90 g  Fluid:  >1.8 L/day    05/28/18 RD, LDN Clinical Nutrition Pager # [phone]

## 2018-03-29 NOTE — Progress Notes (Signed)
RN and NT 3 attempted to insert foley catheter x2 but but were unsuccessful. Charge nurse from Urology floor called. Patient in no acute distress. RN will continue to monitor patient.

## 2018-03-29 NOTE — Progress Notes (Signed)
Patient complaining of abdomen pain and is unable to urinate. Bladder scan revealed greater than 999. MD paged and received orders to insert a foley catheter.

## 2018-03-29 NOTE — Progress Notes (Signed)
PROGRESS NOTE  Emma [name] [id number]:[id number] DOB: June 29, 1932 DOA: 03/28/2018 PCP: Diamantina Providence, FNP  HPI/Recap of past 24 hours: Emma [name] is a 84 y.o. female with medical history significant of Alzheimer's dementia, HTN, RA, CAD.  Patient presents to the ED with c/o nausea and vomiting that began2 days ago. Pt states she has had 3 episodes of vomiting. Denies hematemesis. She also reports diarrhea for the same time period. Estimates about 3 episodes of diarrhea. She is unsure if she has had blood in her stool.   Pt initially denies chest pain. On further questioning she reports has a dull, "light pain" to the middle of her chestthat she estimates began around 3:00pm. States pain is nonradiating. She also reports sweats and chills, but denies shortness of breath, cough, or abd pain. Pt lives at [facility].   Per patients daughter: patient started having N/V/D x2 days ago.  Speech seemed slow and patient confused over past 2 days.  Denies slurred speech.  Pts daughter states that she has seen worms in pts stool a year ago and is concerned. Normally oriented to day of the week, not necessarily to date. Daughter states that norovirus is going around assisted living facility.  Per facility: N/V/D, given 2 imodium for diarrhea, fever 102.  03/29/2018: Patient seen and examined at her bedside.  Urine retention noted by RN.  Bladder ultrasound with greater than 900 cc of urine.  Foley catheter placed in for urinary retention.  Patient states that she has had urinary retention in the past.  Abdominal pain relieved after Foley placement and voiding in the Foley bag.  Assessment/Plan: Principal Problem:   Nausea vomiting and diarrhea Active Problems:   Infiltrate of lower lobe of left lung present on imaging study   Dementia   HTN (hypertension)   AKI (acute kidney injury) (HCC)  Intractable nausea vomiting diarrhea with concern for dehydration GI pathogen panel Continue gentle IV fluid  hydration  Acute metabolic encephalopathy in the setting of advanced dementia Most likely secondary to dehydration versus others  Left lower lobe infiltrate Suspicious for CAP, POA IV azithromycin and IV ceftriaxone day #1 O2 supplementation as needed to maintain O2 sat 92% or greater  AK I suspect prerenal from dehydration versus urinary retention No history of CKD Avoid nephrotoxic agents/hypotension Continue gentle IV hydration Foley catheter in place Obtain renal ultrasound Repeat BMP in the morning  Acute urinary retention Bladder scan revealed greater than 900 cc of urine Foley catheter placed in on 03/29/2018 Consider urology consult if no improvement  Hypertension Blood pressure is well controlled Continue amlodipine Holding off ACE inhibitor and HCTZ due to AK I  Dementia Reorient as needed Fall precaution   Code Status: Full code  Family Communication: None at bedside  Disposition Plan: Assisted living facility when clinically stable   Consultants:  None  Procedures:  Foley catheter placement  Antimicrobials:  IV ceftriaxone and IV azithromycin  DVT prophylaxis: SCDs, sq lovenox daily    Objective: Vitals:   03/28/18 2130 03/28/18 2222 03/29/18 0533 03/29/18 1422  BP: 127/62 (!) 132/55 125/63 137/71  Pulse: 73 82 85 70  Resp: (!) 22  (!) 21 20  Temp:  98.2 F (36.8 C) 97.7 F (36.5 C) 97.6 F (36.4 C)  TempSrc:  Oral Oral Oral  SpO2: 99% 96% 98% 97%  Weight:  75.2 kg (165 lb 12.6 oz)    Height:  5\' 10"  (1.778 m)      Intake/Output Summary (Last  24 hours) at 03/29/2018 2031 Last data filed at 03/29/2018 1500 Gross per 24 hour  Intake 2425 ml  Output -  Net 2425 ml   Filed Weights   03/28/18 2222  Weight: 75.2 kg (165 lb 12.6 oz)    Exam:   General: 84 year old African-American female well-developed well-nourished in no acute distress.  Alert in the setting of dementia.  Cardiovascular: Regular rate and rhythm with no rubs or  gallops.  No JVD or thyromegaly present.  Respiratory: Mild rales at bases with no wheezes.  Abdomen: Soft nontender nondistended with normal bowel sounds x4 quadrant.  Musculoskeletal: Trace lower extremity edema.  Skin: No ulcerative lesions noted.  Psychiatry: Mood is appropriate for condition and setting.   Data Reviewed: CBC: Recent Labs  Lab 03/24/18 1756 03/28/18 1853  WBC 6.7 7.7  NEUTROABS 3.3  --   HGB 12.9 14.0  HCT 38.9 41.0  MCV 92.6 91.9  PLT 211 176   Basic Metabolic Panel: Recent Labs  Lab 03/24/18 1756 03/28/18 1853  NA 136 138  K 4.0 3.2*  CL 102 105  CO2 25 21*  GLUCOSE 104* 129*  BUN 8 30*  CREATININE 0.74 1.54*  CALCIUM 10.0 9.7   GFR: Estimated Creatinine Clearance: 29.9 mL/min (A) (by C-G formula based on SCr of 1.54 mg/dL (H)). Liver Function Tests: Recent Labs  Lab 03/24/18 1756 03/28/18 1853  AST 15 19  ALT 14 18  ALKPHOS 50 50  BILITOT 0.5 1.1  PROT 7.2 8.1  ALBUMIN 4.1 4.4   Recent Labs  Lab 03/28/18 1853  LIPASE 23   No results for input(s): AMMONIA in the last 168 hours. Coagulation Profile: No results for input(s): INR, PROTIME in the last 168 hours. Cardiac Enzymes: Recent Labs  Lab 03/28/18 1853  TROPONINI <0.03   BNP (last 3 results) No results for input(s): PROBNP in the last 8760 hours. HbA1C: No results for input(s): HGBA1C in the last 72 hours. CBG: No results for input(s): GLUCAP in the last 168 hours. Lipid Profile: No results for input(s): CHOL, HDL, LDLCALC, TRIG, CHOLHDL, LDLDIRECT in the last 72 hours. Thyroid Function Tests: No results for input(s): TSH, T4TOTAL, FREET4, T3FREE, THYROIDAB in the last 72 hours. Anemia Panel: No results for input(s): VITAMINB12, FOLATE, FERRITIN, TIBC, IRON, RETICCTPCT in the last 72 hours. Urine analysis:    Component Value Date/Time   COLORURINE YELLOW 03/29/2018 1050   APPEARANCEUR CLEAR 03/29/2018 1050   LABSPEC 1.013 03/29/2018 1050   PHURINE 5.0  03/29/2018 1050   GLUCOSEU NEGATIVE 03/29/2018 1050   HGBUR NEGATIVE 03/29/2018 1050   BILIRUBINUR NEGATIVE 03/29/2018 1050   KETONESUR NEGATIVE 03/29/2018 1050   PROTEINUR NEGATIVE 03/29/2018 1050   NITRITE NEGATIVE 03/29/2018 1050   LEUKOCYTESUR NEGATIVE 03/29/2018 1050   Sepsis Labs: @LABRCNTIP (procalcitonin:4,lacticidven:4)  ) Recent Results (from the past 240 hour(s))  Culture, blood (routine x 2) Call MD if unable to obtain prior to antibiotics being given     Status: None (Preliminary result)   Collection Time: 03/28/18  9:01 PM  Result Value Ref Range Status   Specimen Description   Final    BLOOD LEFT HAND Performed at [facility], [address], [place], [place]    Special Requests   Final    BOTTLES DRAWN AEROBIC AND ANAEROBIC Blood Culture results may not be optimal due to an inadequate volume of blood received in culture bottles Performed at [facility], [address], [place], [place]  Culture   Final    NO GROWTH < 12 HOURS Performed at [facility], [address] [place], [place] [address]    Report Status PENDING  Incomplete  Culture, blood (routine x 2) Call MD if unable to obtain prior to antibiotics being given     Status: None (Preliminary result)   Collection Time: 03/28/18  9:02 PM  Result Value Ref Range Status   Specimen Description   Final    BLOOD RIGHT HAND Performed at [facility], [address] [place], [place] [address]    Special Requests   Final    BOTTLES DRAWN AEROBIC ONLY Blood Culture adequate volume Performed at [facility], [address] [place], [place] [address]    Culture   Final    NO GROWTH < 12 HOURS Performed at [facility], [address], [place], [place] [address]    Report Status PENDING  Incomplete  MRSA PCR Screening     Status: None   Collection Time: 03/29/18  5:30 AM  Result Value Ref Range  Status   MRSA by PCR NEGATIVE NEGATIVE Final    Comment:        The GeneXpert MRSA Assay (FDA approved for NASAL specimens only), is one component of a comprehensive MRSA colonization surveillance program. It is not intended to diagnose MRSA infection nor to guide or monitor treatment for MRSA infections. Performed at [facility], [address], [place], [place] [address]       Studies: No results found.  Scheduled Meds: . amLODipine  5 mg Oral Daily  . benztropine  0.25 mg Oral BID  . chlorhexidine  15 mL Mouth Rinse BID  . cholecalciferol  1,000 Units Oral Daily  . enoxaparin (LOVENOX) injection  30 mg Subcutaneous QHS  . feeding supplement  1 Container Oral BID BM  . latanoprost  1 drop Both Eyes QHS  . mouth rinse  15 mL Mouth Rinse q12n4p  . pantoprazole  20 mg Oral Once  . risperiDONE  1 mg Oral Daily  . risperiDONE  2 mg Oral QHS  . vitamin A  10,000 Units Oral Daily  . vitamin B-12  100 mcg Oral Daily    Continuous Infusions: . sodium chloride 125 mL/hr at 03/29/18 1443  . azithromycin 500 mg (03/28/18 2354)  . cefTRIAXone (ROCEPHIN)  IV 1 g (03/29/18 0108)     LOS: 1 day     Darlin Drop, MD [facility] Pager [phone]  If 7PM-7AM, please contact night-coverage [email or web address] Password [facility] 03/29/2018, 8:31 PM

## 2018-03-30 DIAGNOSIS — F0280 Dementia in other diseases classified elsewhere without behavioral disturbance: Secondary | ICD-10-CM

## 2018-03-30 DIAGNOSIS — I10 Essential (primary) hypertension: Secondary | ICD-10-CM

## 2018-03-30 DIAGNOSIS — R112 Nausea with vomiting, unspecified: Secondary | ICD-10-CM

## 2018-03-30 DIAGNOSIS — N179 Acute kidney failure, unspecified: Secondary | ICD-10-CM

## 2018-03-30 DIAGNOSIS — G309 Alzheimer's disease, unspecified: Secondary | ICD-10-CM

## 2018-03-30 DIAGNOSIS — R197 Diarrhea, unspecified: Secondary | ICD-10-CM

## 2018-03-30 DIAGNOSIS — J181 Lobar pneumonia, unspecified organism: Secondary | ICD-10-CM

## 2018-03-30 DIAGNOSIS — J189 Pneumonia, unspecified organism: Secondary | ICD-10-CM

## 2018-03-30 DIAGNOSIS — R918 Other nonspecific abnormal finding of lung field: Secondary | ICD-10-CM

## 2018-03-30 LAB — URINE CULTURE: Culture: NO GROWTH

## 2018-03-30 LAB — CBC
HEMATOCRIT: 36.1 % (ref 36.0–46.0)
HEMOGLOBIN: 11.9 g/dL — AB (ref 12.0–15.0)
MCH: 30.7 pg (ref 26.0–34.0)
MCHC: 33 g/dL (ref 30.0–36.0)
MCV: 93 fL (ref 78.0–100.0)
Platelets: 132 10*3/uL — ABNORMAL LOW (ref 150–400)
RBC: 3.88 MIL/uL (ref 3.87–5.11)
RDW: 13.7 % (ref 11.5–15.5)
WBC: 5.5 10*3/uL (ref 4.0–10.5)

## 2018-03-30 LAB — BASIC METABOLIC PANEL
ANION GAP: 8 (ref 5–15)
BUN: 8 mg/dL (ref 6–20)
CO2: 21 mmol/L — ABNORMAL LOW (ref 22–32)
Calcium: 8.7 mg/dL — ABNORMAL LOW (ref 8.9–10.3)
Chloride: 112 mmol/L — ABNORMAL HIGH (ref 101–111)
Creatinine, Ser: 0.57 mg/dL (ref 0.44–1.00)
GFR calc Af Amer: >60 mL/min (ref >60)
GFR calc non Af Amer: >60 mL/min (ref >60)
GLUCOSE: 96 mg/dL (ref 65–99)
POTASSIUM: 3.5 mmol/L (ref 3.5–5.1)
Sodium: 141 mmol/L (ref 135–145)

## 2018-03-30 MED ORDER — TAMSULOSIN HCL 0.4 MG PO CAPS
0.4000 mg | ORAL_CAPSULE | Freq: Every day | ORAL | Status: DC
  Administered 2018-03-30 – 2018-03-31 (×2): 0.4 mg via ORAL
  Filled 2018-03-30 (×3): qty 1

## 2018-03-30 MED ORDER — ENOXAPARIN SODIUM 40 MG/0.4ML ~~LOC~~ SOLN
40.0000 mg | Freq: Every day | SUBCUTANEOUS | Status: DC
  Administered 2018-03-30: 40 mg via SUBCUTANEOUS
  Filled 2018-03-30: qty 0.4

## 2018-03-30 MED ORDER — AMLODIPINE BESYLATE 5 MG PO TABS
5.0000 mg | ORAL_TABLET | Freq: Once | ORAL | Status: AC
  Administered 2018-03-30: 5 mg via ORAL
  Filled 2018-03-30: qty 1

## 2018-03-30 MED ORDER — AMLODIPINE BESYLATE 10 MG PO TABS
10.0000 mg | ORAL_TABLET | Freq: Every day | ORAL | Status: DC
  Administered 2018-03-31: 10 mg via ORAL
  Filled 2018-03-30: qty 1

## 2018-03-30 NOTE — Progress Notes (Signed)
PROGRESS NOTE    Emma [name]  TSV:[id number] DOB: 05/13/1932 DOA: 03/28/2018 PCP: Diamantina Providence, FNP    Brief Narrative:  Emma [name] a 84 y.o.femalewith medical history significant ofAlzheimer's [name], Emma [name], Emma [name], CAD. Patient presents to the ED with c/o nausea and vomiting that began2 days ago. Pt states she has had 3 episodes of vomiting. Denies hematemesis. She also reports diarrhea for the same time period. Estimates about 3 episodes of diarrhea. She is unsure if she has had blood in her stool.   Pt initially denies chest pain. On further questioning she reports has a dull, "light pain" to the middle of her chestthat she estimates began around 3:00pm. States pain is nonradiating. She also reports sweats and chills, but denies shortness of breath, cough, or abd pain. Pt lives at [facility].  Per patients daughter: patient started having N/V/D x2 days ago. Speech seemed slow and patient confused over past 2 days. Denies slurred speech. Pts daughter states that she has seen worms in pts stool a year ago and is concerned. Normally oriented to day of the week, not necessarily to date.Daughter states that norovirus is going around assisted living facility.  Per facility: N/V/D, given 2 imodium for diarrhea, fever 102.     Assessment & Plan:   Principal Problem:   Nausea vomiting and diarrhea Active Problems:   Infiltrate of lower lobe of left lung present on imaging study   [name]   Emma [name] (hypertension)   AKI (acute kidney injury) (HCC)  #1 nausea vomiting diarrhea Likely secondary to a viral gastroenteritis.  Patient with no further diarrhea nausea vomiting.  Patient tolerating clears and asking for diet to be advanced.  Will advance to a heart healthy diet.  Saline lock IV fluids.  Supportive care.  2.  Community acquired pneumonia Patient from assisted living facility presented with nausea vomiting and diarrhea.  Chest x-ray concerning for left lower lobe  infiltrate.  Patient currently afebrile.  Urine strep pneumococcus antigen is negative.  Continue empiric IV Rocephin and azithromycin.  Likely transition to oral antibiotics in the next 1-2 days.  3.  [name] Stable.  4.  Acute metabolic encephalopathy Likely secondary to #1 and #2.  Clinical improvement.  Patient with no further diarrhea and as such unable to get a GI pathogen panel.  Patient alert to self place and time.  Patient knows who the president is.  Continue empiric IV antibiotics.  Supportive care.  5.  Hypertension Currently stable.  Saline lock IV fluids.  Increase home dose Norvasc to 10 mg daily.  Lisinopril on hold secondary to acute kidney injury.  Follow.  6.  Acute kidney injury Likely secondary to prerenal azotemia in the setting of ACE inhibitor.  ACE inhibitor on hold.  Renal function improved on IV hydration.  Saline lock IV fluids.  Follow.  7.  Hypokalemia Repleted.  Follow.    DVT prophylaxis: Lovenox Code Status: Full Family Communication: updated patient, no family present. Disposition Plan: Likely back to skilled nursing facility when medically stable, clinical improvement and on oral antibiotics.   Consultants:   None  Procedures:   CT head 03/28/2018,  Chest x-ray 03/28/2018  Antimicrobials:   IV azithromycin 03/28/2018  IV Rocephin 03/28/2018   Subjective: Patient sitting up in bed eating clear liquids and tolerating it.  Denies any chest pain or shortness of breath.  No abdominal pain.  No nausea or vomiting.  No loose stools since admission.  Objective: Vitals:   03/29/18 0533  03/29/18 1422 03/29/18 2049 03/30/18 0607  BP: 125/63 137/71 (!) 137/47 138/65  Pulse: 85 70 61 65  Resp: (!) 21 20 18 18   Temp: 97.7 F (36.5 C) 97.6 F (36.4 C) (!) 97.5 F (36.4 C) 98.3 F (36.8 C)  TempSrc: Oral Oral Oral Oral  SpO2: 98% 97% 98% 97%  Weight:      Height:        Intake/Output Summary (Last 24 hours) at 03/30/2018 1400 Last data filed  at 03/30/2018 0807 Gross per 24 hour  Intake 3002.5 ml  Output 2650 ml  Net 352.5 ml   Filed Weights   03/28/18 2222  Weight: 75.2 kg (165 lb 12.6 oz)    Examination:  General exam: Appears calm and comfortable  Respiratory system: Clear to auscultation. Respiratory effort normal. Cardiovascular system: S1 & S2 heard, RRR. No JVD, murmurs, rubs, gallops or clicks. No pedal edema. Gastrointestinal system: Abdomen is nondistended, soft and nontender. No organomegaly or masses felt. Normal bowel sounds heard. Central nervous system: Alert and oriented. No focal neurological deficits. Extremities: Symmetric 5 x 5 power. Skin: No rashes, lesions or ulcers Psychiatry: Judgement and insight appear normal. Mood & affect appropriate.     Data Reviewed: I have personally reviewed following labs and imaging studies  CBC: Recent Labs  Lab 03/24/18 1756 03/28/18 1853 03/30/18 0621  WBC 6.7 7.7 5.5  NEUTROABS 3.3  --   --   HGB 12.9 14.0 11.9*  HCT 38.9 41.0 36.1  MCV 92.6 91.9 93.0  PLT 211 176 132*   Basic Metabolic Panel: Recent Labs  Lab 03/24/18 1756 03/28/18 1853 03/30/18 0621  NA 136 138 141  K 4.0 3.2* 3.5  CL 102 105 112*  CO2 25 21* 21*  GLUCOSE 104* 129* 96  BUN 8 30* 8  CREATININE 0.74 1.54* 0.57  CALCIUM 10.0 9.7 8.7*   GFR: Estimated Creatinine Clearance: 57.6 mL/min (by C-G formula based on SCr of 0.57 mg/dL). Liver Function Tests: Recent Labs  Lab 03/24/18 1756 03/28/18 1853  AST 15 19  ALT 14 18  ALKPHOS 50 50  BILITOT 0.5 1.1  PROT 7.2 8.1  ALBUMIN 4.1 4.4   Recent Labs  Lab 03/28/18 1853  LIPASE 23   No results for input(s): AMMONIA in the last 168 hours. Coagulation Profile: No results for input(s): INR, PROTIME in the last 168 hours. Cardiac Enzymes: Recent Labs  Lab 03/28/18 1853  TROPONINI <0.03   BNP (last 3 results) No results for input(s): PROBNP in the last 8760 hours. HbA1C: No results for input(s): HGBA1C in the last 72  hours. CBG: No results for input(s): GLUCAP in the last 168 hours. Lipid Profile: No results for input(s): CHOL, HDL, LDLCALC, TRIG, CHOLHDL, LDLDIRECT in the last 72 hours. Thyroid Function Tests: No results for input(s): TSH, T4TOTAL, FREET4, T3FREE, THYROIDAB in the last 72 hours. Anemia Panel: No results for input(s): VITAMINB12, FOLATE, FERRITIN, TIBC, IRON, RETICCTPCT in the last 72 hours. Sepsis Labs: Recent Labs  Lab 03/24/18 1831  LATICACIDVEN 0.98    Recent Results (from the past 240 hour(s))  Culture, blood (routine x 2) Call MD if unable to obtain prior to antibiotics being given     Status: None (Preliminary result)   Collection Time: 03/28/18  9:01 PM  Result Value Ref Range Status   Specimen Description   Final    BLOOD LEFT HAND Performed at [facility], [address], [place], [place]    Special  Requests   Final    BOTTLES DRAWN AEROBIC AND ANAEROBIC Blood Culture results may not be optimal due to an inadequate volume of blood received in culture bottles Performed at [facility], [address] [place], [place] [address]    Culture   Final    NO GROWTH 2 DAYS Performed at [facility], [address], [place], [place] [address]    Report Status PENDING  Incomplete  Culture, blood (routine x 2) Call MD if unable to obtain prior to antibiotics being given     Status: None (Preliminary result)   Collection Time: 03/28/18  9:02 PM  Result Value Ref Range Status   Specimen Description   Final    BLOOD RIGHT HAND Performed at [facility], [address], [place], [place] [address]    Special Requests   Final    BOTTLES DRAWN AEROBIC ONLY Blood Culture adequate volume Performed at [facility], [address] [place], [place] [address]    Culture   Final    NO GROWTH 2 DAYS Performed at [facility], [address], [place], [place] [address]    Report  Status PENDING  Incomplete  MRSA PCR Screening     Status: None   Collection Time: 03/29/18  5:30 AM  Result Value Ref Range Status   MRSA by PCR NEGATIVE NEGATIVE Final    Comment:        The GeneXpert MRSA Assay (FDA approved for NASAL specimens only), is one component of a comprehensive MRSA colonization surveillance program. It is not intended to diagnose MRSA infection nor to guide or monitor treatment for MRSA infections. Performed at [facility], [address] [place], [place] [address]   Urine culture     Status: None   Collection Time: 03/29/18 10:50 AM  Result Value Ref Range Status   Specimen Description   Final    URINE, CLEAN CATCH Performed at [facility], [address] [place], [place] [address]    Special Requests   Final    NONE Performed at [facility], [address], [place], [place] [address]    Culture   Final    NO GROWTH Performed at [facility], [address], [place], [place] [address]    Report Status 03/30/2018 FINAL  Final         Radiology Studies: Dg Chest 2 View  Result Date: 03/28/2018 CLINICAL DATA:  Chest pain EXAM: CHEST - 2 VIEW COMPARISON:  03/24/2018 FINDINGS: Left lower lobe infiltrate with probable small left effusion. Right lung is clear. Normal heart size. No pneumothorax. IMPRESSION: Left lower lobe infiltrate with probable adjacent small effusion. Radiographic follow-up to resolution recommended. Electronically Signed   By: Jasmine Pang M.D.   On: 03/28/2018 20:06   Ct Head Wo Contrast  Result Date: 03/28/2018 CLINICAL DATA:  Altered mental status EXAM: CT HEAD WITHOUT CONTRAST TECHNIQUE: Contiguous axial images were obtained from the base of the skull through the vertex without intravenous contrast. COMPARISON:  03/24/2018 FINDINGS: Brain: No evidence of acute infarction, hemorrhage, hydrocephalus, extra-axial collection or mass lesion/mass effect. Mild  small vessel ischemic changes of the white matter and atrophy. Vascular: Carotid vascular calcification.  No hyperdense vessel Skull: No acute abnormality Sinuses/Orbits: Mucosal thickening in the ethmoid and sphenoid sinuses. No acute orbital abnormality. Other: None IMPRESSION: 1. No CT evidence for acute intracranial abnormality. 2. Atrophy and mild  small vessel ischemic changes of the white matter Electronically Signed   By: Jasmine Pang M.D.   On: 03/28/2018 20:11        Scheduled Meds: . amLODipine  5 mg Oral Daily  . benztropine  0.25 mg Oral BID  . chlorhexidine  15 mL Mouth Rinse BID  . cholecalciferol  1,000 Units Oral Daily  . enoxaparin (LOVENOX) injection  40 mg Subcutaneous QHS  . feeding supplement  1 Container Oral BID BM  . latanoprost  1 drop Both Eyes QHS  . mouth rinse  15 mL Mouth Rinse q12n4p  . pantoprazole  20 mg Oral Once  . risperiDONE  1 mg Oral Daily  . risperiDONE  2 mg Oral QHS  . tamsulosin  0.4 mg Oral Daily  . vitamin A  10,000 Units Oral Daily  . vitamin B-12  100 mcg Oral Daily   Continuous Infusions: . sodium chloride 125 mL/hr at 03/30/18 0023  . azithromycin 500 mg (03/29/18 2248)  . cefTRIAXone (ROCEPHIN)  IV 1 g (03/29/18 2059)     LOS: 2 days    Time spent: 35 minutes    Ramiro Harvest, MD [facility] Pager [phone]  If 7PM-7AM, please contact night-coverage [email or web address] Password [email or web address] 03/30/2018, 2:00 PM

## 2018-03-30 NOTE — Clinical Social Work Note (Signed)
Clinical Social Work Assessment  Patient Details  Name: Emma [name] MRN: [id number] Date of Birth: 06/07/1932  Date of referral:  03/30/18               Reason for consult:  Facility Placement                Permission sought to share information with:  Family Supports, Chartered certified accountant granted to share information::  Yes, Verbal Permission Granted  Name::     Kathlynn Grate and [place]::  [place]  Relationship::  Son and daughter  Sport and exercise psychologist Information:     Housing/Transportation Living arrangements for the past 2 months:  [place] of Information:  Patient Patient Interpreter Needed:  None Criminal Activity/Legal Involvement Pertinent to Current Situation/Hospitalization:  No - Comment as needed Significant Relationships:  Warehouse manager, Adult Children Lives with:  Facility Resident Do you feel safe going back to the place where you live?  Yes Need for family participation in patient care:  Yes (Comment)  Care giving concerns:  No care giving concerns at the time of assessment.    Social Worker assessment / plan:  LCSW following for facility placement.  Patient is from [facility].  Patient admitted for nausea, vomiting and diarrhea.  LCSW met with patient at bedside. No family present.  LCSW explained role and reason for visit.   Patient reports that she has been at [facility] a little less than a year. Patient reports that she is happy there. At baseline patient reports that she ambulates with a cane. Patient requires minimal assistance with ADLs.   Patient states that she thinks the steak she ate made her sick.   Patient reports her daughter and son as supports.  PLAN: Patient will return to ALF at dc.   Employment status:  Retired Nurse, adult PT Recommendations:  Not assessed at this time Information / Referral to community resources:     Patient/Family's Response to care:   Patient is thankful for LCSW visit. Patient said she appreciates LCSW helping her get back to [facility].   Patient/Family's Understanding of and Emotional Response to Diagnosis, Current Treatment, and Prognosis:  Patient reports that she is feeling better ready to back to [facility].   Emotional Assessment Appearance:  Appears stated age Attitude/Demeanor/Rapport:    Affect (typically observed):  Accepting, Calm, Pleasant Orientation:  Oriented to Self, Oriented to Place, Oriented to  Time Alcohol / Substance use:  Not Applicable Psych involvement (Current and /or in the community):  No (Comment)  Discharge Needs  Concerns to be addressed:  No discharge needs identified Readmission within the last 30 days:  No Current discharge risk:  None Barriers to Discharge:  Continued Medical Work up   [employer], LCSW 03/30/2018, 11:10 AM

## 2018-03-31 DIAGNOSIS — R338 Other retention of urine: Secondary | ICD-10-CM

## 2018-03-31 LAB — BASIC METABOLIC PANEL
ANION GAP: 7 (ref 5–15)
BUN: <5 mg/dL — ABNORMAL LOW (ref 6–20)
CHLORIDE: 112 mmol/L — AB (ref 101–111)
CO2: 23 mmol/L (ref 22–32)
CREATININE: 0.54 mg/dL (ref 0.44–1.00)
Calcium: 9 mg/dL (ref 8.9–10.3)
GFR calc non Af Amer: >60 mL/min (ref >60)
Glucose, Bld: 96 mg/dL (ref 65–99)
Potassium: 3.3 mmol/L — ABNORMAL LOW (ref 3.5–5.1)
SODIUM: 142 mmol/L (ref 135–145)

## 2018-03-31 LAB — CBC
HEMATOCRIT: 35.4 % — AB (ref 36.0–46.0)
HEMOGLOBIN: 12 g/dL (ref 12.0–15.0)
MCH: 30.9 pg (ref 26.0–34.0)
MCHC: 33.9 g/dL (ref 30.0–36.0)
MCV: 91.2 fL (ref 78.0–100.0)
Platelets: 131 10*3/uL — ABNORMAL LOW (ref 150–400)
RBC: 3.88 MIL/uL (ref 3.87–5.11)
RDW: 13.2 % (ref 11.5–15.5)
WBC: 5.6 10*3/uL (ref 4.0–10.5)

## 2018-03-31 LAB — MAGNESIUM: MAGNESIUM: 1.7 mg/dL (ref 1.7–2.4)

## 2018-03-31 MED ORDER — HYDROCHLOROTHIAZIDE 12.5 MG PO CAPS
12.5000 mg | ORAL_CAPSULE | Freq: Every day | ORAL | Status: DC
  Administered 2018-03-31: 12.5 mg via ORAL
  Filled 2018-03-31: qty 1

## 2018-03-31 MED ORDER — PANTOPRAZOLE SODIUM 40 MG PO TBEC: 40.0000 mg | DELAYED_RELEASE_TABLET | Freq: Every day | ORAL | 0 refills | Status: AC

## 2018-03-31 MED ORDER — AMOXICILLIN-POT CLAVULANATE 875-125 MG PO TABS
1.0000 | ORAL_TABLET | Freq: Two times a day (BID) | ORAL | Status: DC
  Administered 2018-03-31: 1 via ORAL
  Filled 2018-03-31: qty 1

## 2018-03-31 MED ORDER — POTASSIUM CHLORIDE CRYS ER 10 MEQ PO TBCR
40.0000 meq | EXTENDED_RELEASE_TABLET | Freq: Once | ORAL | Status: AC
  Administered 2018-03-31: 40 meq via ORAL
  Filled 2018-03-31: qty 4

## 2018-03-31 MED ORDER — PANTOPRAZOLE SODIUM 40 MG PO TBEC
40.0000 mg | DELAYED_RELEASE_TABLET | Freq: Every day | ORAL | Status: DC
Start: 2018-03-31 — End: 2018-03-31

## 2018-03-31 MED ORDER — AMLODIPINE BESYLATE 10 MG PO TABS: 10.0000 mg | ORAL_TABLET | Freq: Every day | ORAL | 0 refills | Status: DC

## 2018-03-31 MED ORDER — MAGNESIUM SULFATE 4 GM/100ML IV SOLN
4.0000 g | Freq: Once | INTRAVENOUS | Status: AC
  Administered 2018-03-31: 4 g via INTRAVENOUS
  Filled 2018-03-31: qty 100

## 2018-03-31 MED ORDER — AMOXICILLIN-POT CLAVULANATE 875-125 MG PO TABS: 1.0000 | ORAL_TABLET | Freq: Two times a day (BID) | ORAL | 0 refills | Status: AC

## 2018-03-31 MED ORDER — TAMSULOSIN HCL 0.4 MG PO CAPS: 0.4000 mg | ORAL_CAPSULE | Freq: Every day | ORAL | 0 refills | Status: AC

## 2018-03-31 NOTE — Care Management Important Message (Signed)
Important Message  Patient Details  Name: Emma [name] MRN: [id number] Date of Birth: 1932-07-02   Medicare Important Message Given:  Yes    Caren Macadam 03/31/2018, 11:15 AMImportant Message  Patient Details  Name: Emma [name] MRN: [id number] Date of Birth: 1932-09-29   Medicare Important Message Given:  Yes    Caren Macadam 03/31/2018, 11:15 AM

## 2018-03-31 NOTE — Discharge Summary (Signed)
Physician Discharge Summary  Emma [name] MHD:[id number] DOB: [birth year] DOA: 03/28/2018  PCP: Diamantina Providence, FNP  Admit date: 03/28/2018 Discharge date: 03/31/2018  Time spent: 55 minutes  Recommendations for Outpatient Follow-up:  1. Follow-up with Diamantina Providence, FNP in 1 week.  On follow-up patient is urinary retention will need to be reassessed and patient will need a voiding trial.  Patient will also need a basic metabolic profile done to follow-up on electrolytes and renal function.  Patient's ACE inhibitor was discontinued during this hospitalization due to acute kidney injury.  Patient's blood pressure need to be reassessed    Discharge Diagnoses:  Principal Problem:   Nausea vomiting and diarrhea Active Problems:   Infiltrate of lower lobe of left lung present on imaging study   Dementia   HTN (hypertension)   AKI (acute kidney injury) (HCC)   Community acquired pneumonia of left lower lobe of lung (HCC)   Acute urinary retention   Discharge Condition: Stable and improved  Diet recommendation: Heart healthy  Filed Weights   03/28/18 2222  Weight: 75.2 kg (165 lb 12.6 oz)    History of present illness:  Per Dr. Rivka Spring Bynumis a 84 y.o.femalewith medical history significant ofAlzheimer's dementia, HTN, RA, CAD. Patient presented to the ED with c/o nausea and vomiting that began2 days prior to admission. Pt stated she had 3 episodes of vomiting. Denied hematemesis. She also reported diarrhea for the same time period. Estimated about 3 episodes of diarrhea. She was unsure if she has had blood in her stool.   Pt initially denied chest pain. On further questioning she reported has a dull, "light pain" to the middle of her chestthat she estimates began around 3:00pm. States pain was nonradiating. She also reported sweats and chills, but denied shortness of breath, cough, or abd pain. Pt lives at [facility].  Per patients daughter: patient started  having N/V/D x2 days ago. Speech seemed slow and patient confused over past 2 days. Denied slurred speech. Pts daughter stated that she has seen worms in pts stool a year ago and was concerned. Normally oriented to day of the week, not necessarily to date.Daughter stated that norovirus was going around assisted living facility.  Per facility: N/V/D, given 2 imodium for diarrhea, fever 102.   ED Course:Labs show AKI. UA neg. Given 1L NS, zofran. CXR shows LLL infiltrate. Hospitalist asked to admit.    Hospital Course:  #1 nausea vomiting diarrhea Likely secondary to a viral gastroenteritis. Patient with no further diarrhea nausea vomiting during the hospitalization.  Patient was subsequently started on a clear liquid diet which she tolerated.  Diet was subsequently advanced to a heart healthy diet which he tolerated.  Patient was initially hydrated with IV fluids and IV fluids subsequently discontinued.  Patient improved clinically and was back to baseline by day of discharge.  Outpatient follow-up.  2. Community acquired pneumonia Patient from assisted living facility presented with nausea vomiting and diarrhea. Chest x-ray concerning for left lower lobe infiltrate. Patient remained afebrile.  Patient was pancultured with no growth to date.  Urine strep pneumococcus antigen was negative.  Patient was placed empirically on IV Rocephin and IV azithromycin.  Patient improved clinically.  Patient subsequently transitioned to oral Augmentin and will be discharged home on 4 more days of oral Augmentin to complete a one-week course of antibiotic treatment.  Outpatient follow-up.   3. Dementia Stable.  Patient maintained on home regimen of Risperdal.  4. Acute metabolic encephalopathy Likely  secondary to #1 and #2. Patient with no further diarrhea and as such unable to get a GI pathogen panel.  Patient was started empirically on IV antibiotics for community-acquired pneumonia.   Patient was hydrated with IV fluids.  Patient improved clinically and was back to baseline by day of discharge.  Outpatient follow-up.  5. Hypertension Patient is ACE inhibitor and diuretic were held on admission secondary to acute kidney injury.  Patient was maintained on home regimen of Norvasc 5 mg daily which was uptitrated to 10 mg daily for better blood pressure control.  Patient's HCTZ was subsequently resumed.  Patient's lisinopril was discontinued and will not be resumed on discharge.  Outpatient follow-up.   6. Acute kidney injury Likely secondary to prerenal azotemia in the setting of ACE inhibitor. ACE inhibitor was discontinued during the hospitalization.  HCTZ was held.  Patient was hydrated with IV fluids with resolution of acute kidney injury.  Patient's ACE inhibitor will not be resumed on discharge.  Outpatient follow-up.   7. Hypokalemia Repleted. Follow.  8 urinary retention Patient was noted to have urinary retention during the hospitalization.  Foley catheter was placed and patient started on Flomax.  Patient with good urine output.  Patient will be discharged to assisted living facility with Foley catheter.  Patient will need a voiding trial either at the facility in about 4-5 days or follow-up with PCP in 1 week.      Procedures:  CT head 03/28/2018,  Chest x-ray 03/28/2018   Consultations:  None  Discharge Exam: Vitals:   03/31/18 0458 03/31/18 1549  BP: (!) 144/59 (!) 159/69  Pulse: 73 75  Resp: 18 20  Temp: 98.1 F (36.7 C) 98.1 F (36.7 C)  SpO2: 98% 98%    General: NAD Cardiovascular: RRR Respiratory: CTAB  Discharge Instructions   Discharge Instructions    Diet - low sodium heart healthy   Complete by:  As directed    Increase activity slowly   Complete by:  As directed      Allergies as of 03/31/2018   No Known Allergies     Medication List    STOP taking these medications   lisinopril 20 MG tablet Commonly known as:   PRINIVIL,ZESTRIL     TAKE these medications   amLODipine 10 MG tablet Commonly known as:  NORVASC Take 1 tablet (10 mg total) by mouth daily. What changed:    medication strength  how much to take   amoxicillin-clavulanate 875-125 MG tablet Commonly known as:  AUGMENTIN Take 1 tablet by mouth every 12 (twelve) hours for 4 days.   benztropine 0.5 MG tablet Commonly known as:  COGENTIN Take 0.5 tablets by mouth 2 (two) times daily.   cholecalciferol 1000 units tablet Commonly known as:  VITAMIN D Take 1,000 Units by mouth daily.   hydrochlorothiazide 12.5 MG capsule Commonly known as:  MICROZIDE Take 12.5 capsules by mouth daily.   pantoprazole 40 MG tablet Commonly known as:  PROTONIX Take 1 tablet (40 mg total) by mouth daily at 6 (six) AM.   risperiDONE 1 MG tablet Commonly known as:  RISPERDAL Take 1 mg by mouth daily.   risperiDONE 2 MG tablet Commonly known as:  RISPERDAL Take 2 mg by mouth daily. At bedtime   tamsulosin 0.4 MG Caps capsule Commonly known as:  FLOMAX Take 1 capsule (0.4 mg total) by mouth daily. Start taking on:  04/01/2018   TRAVATAN Z 0.004 % Soln ophthalmic solution Generic drug:  Travoprost (BAK  Free) Place 1 drop into both eyes at bedtime.   traZODone 50 MG tablet Commonly known as:  DESYREL Take 50 mg by mouth at bedtime as needed for sleep.   vitamin A 40981 UNIT capsule Take 10,000 Units by mouth daily.   vitamin B-12 100 MCG tablet Commonly known as:  CYANOCOBALAMIN Take 100 mcg by mouth daily.   vitamin C 500 MG tablet Commonly known as:  ASCORBIC ACID Take 500 mg by mouth daily.      No Known Allergies Follow-up Information    Diamantina Providence, FNP. Schedule an appointment as soon as possible for a visit in 1 week(s).   Specialty:  Nurse Practitioner Why:  f/u in 1 week for HFU and voiding trial Contact information: [address] Ginette Otto [place] [address] [phone]            The results  of significant diagnostics from this hospitalization (including imaging, microbiology, ancillary and laboratory) are listed below for reference.    Significant Diagnostic Studies: Dg Chest 2 View  Result Date: 03/28/2018 CLINICAL DATA:  Chest pain EXAM: CHEST - 2 VIEW COMPARISON:  03/24/2018 FINDINGS: Left lower lobe infiltrate with probable small left effusion. Right lung is clear. Normal heart size. No pneumothorax. IMPRESSION: Left lower lobe infiltrate with probable adjacent small effusion. Radiographic follow-up to resolution recommended. Electronically Signed   By: Jasmine Pang M.D.   On: 03/28/2018 20:06   Dg Chest 2 View  Result Date: 03/24/2018 CLINICAL DATA:  84 year old female with chest pain. Left side neurologic deficits. Possible stroke. EXAM: CHEST - 2 VIEW COMPARISON:  Chest radiographs 11/15/2017. FINDINGS: Upright AP and lateral views of the chest. Lung volumes are stable and within normal limits. Stable mild cardiomegaly and mediastinal contours. Visualized tracheal air column is within normal limits. Both lungs appear clear. No pneumothorax. No pleural effusion is evident. No acute osseous abnormality identified. Negative visible bowel gas pattern. IMPRESSION: No acute cardiopulmonary abnormality. Electronically Signed   By: Odessa Fleming M.D.   On: 03/24/2018 18:56   Ct Head Wo Contrast  Result Date: 03/28/2018 CLINICAL DATA:  Altered mental status EXAM: CT HEAD WITHOUT CONTRAST TECHNIQUE: Contiguous axial images were obtained from the base of the skull through the vertex without intravenous contrast. COMPARISON:  03/24/2018 FINDINGS: Brain: No evidence of acute infarction, hemorrhage, hydrocephalus, extra-axial collection or mass lesion/mass effect. Mild small vessel ischemic changes of the white matter and atrophy. Vascular: Carotid vascular calcification.  No hyperdense vessel Skull: No acute abnormality Sinuses/Orbits: Mucosal thickening in the ethmoid and sphenoid sinuses. No acute  orbital abnormality. Other: None IMPRESSION: 1. No CT evidence for acute intracranial abnormality. 2. Atrophy and mild small vessel ischemic changes of the white matter Electronically Signed   By: Jasmine Pang M.D.   On: 03/28/2018 20:11   Ct Head Wo Contrast  Result Date: 03/24/2018 CLINICAL DATA:  Left-sided headache radiating down the left side of the neck EXAM: CT HEAD WITHOUT CONTRAST TECHNIQUE: Contiguous axial images were obtained from the base of the skull through the vertex without intravenous contrast. COMPARISON:  None. FINDINGS: Brain: No evidence of accelerated atrophy. No sign of old or acute focal small or large vessel infarction. No mass lesion, hemorrhage, hydrocephalus or extra-axial collection. Vascular: There is atherosclerotic calcification of the major vessels at the base of the brain. Skull: Normal Sinuses/Orbits: Mild mucosal inflammatory changes of the paranasal sinuses, frequently seen and often subclinical. Other: None IMPRESSION: No cause of the presenting symptoms is identified.  Normal appearance of the brain for age. Ordinary atherosclerotic calcification of the major vessels at the base the brain. Mild sinus mucosal thickening. Electronically Signed   By: Paulina Fusi M.D.   On: 03/24/2018 19:26    Microbiology: Recent Results (from the past 240 hour(s))  Culture, blood (routine x 2) Call MD if unable to obtain prior to antibiotics being given     Status: None (Preliminary result)   Collection Time: 03/28/18  9:01 PM  Result Value Ref Range Status   Specimen Description   Final    BLOOD LEFT HAND Performed at [facility], [address], [place], [place] [address]    Special Requests   Final    BOTTLES DRAWN AEROBIC AND ANAEROBIC Blood Culture results may not be optimal due to an inadequate volume of blood received in culture bottles Performed at [facility], [address], [place], [place] [address]    Culture   Final     NO GROWTH 3 DAYS Performed at [facility], [address], [place], [place] [address]    Report Status PENDING  Incomplete  Culture, blood (routine x 2) Call MD if unable to obtain prior to antibiotics being given     Status: None (Preliminary result)   Collection Time: 03/28/18  9:02 PM  Result Value Ref Range Status   Specimen Description   Final    BLOOD RIGHT HAND Performed at [facility], [address], [place], [place] [address]    Special Requests   Final    BOTTLES DRAWN AEROBIC ONLY Blood Culture adequate volume Performed at [facility], [address], [place], [place] [address]    Culture   Final    NO GROWTH 3 DAYS Performed at [facility], [address] [place], [place] [address]    Report Status PENDING  Incomplete  MRSA PCR Screening     Status: None   Collection Time: 03/29/18  5:30 AM  Result Value Ref Range Status   MRSA by PCR NEGATIVE NEGATIVE Final    Comment:        The GeneXpert MRSA Assay (FDA approved for NASAL specimens only), is one component of a comprehensive MRSA colonization surveillance program. It is not intended to diagnose MRSA infection nor to guide or monitor treatment for MRSA infections. Performed at [facility], [address], [place], [place] [address]   Urine culture     Status: None   Collection Time: 03/29/18 10:50 AM  Result Value Ref Range Status   Specimen Description   Final    URINE, CLEAN CATCH Performed at [facility], [address], [place], [place] [address]    Special Requests   Final    NONE Performed at [facility], [address], [place], [place] [address]    Culture   Final    NO GROWTH Performed at [facility], [address], [place], [place] [address]    Report Status 03/30/2018 FINAL  Final     Labs: Basic Metabolic Panel: Recent Labs  Lab 03/24/18 1756 03/28/18 1853  03/30/18 0621 03/31/18 0602  NA 136 138 141 142  K 4.0 3.2* 3.5 3.3*  CL 102 105 112* 112*  CO2 25 21* 21* 23  GLUCOSE 104* 129* 96 96  BUN 8 30* 8 <5*  CREATININE 0.74 1.54* 0.57 0.54  CALCIUM 10.0 9.7 8.7* 9.0  MG  --   --   --  1.7   Liver Function Tests: Recent Labs  Lab 03/24/18 1756 03/28/18 1853  AST 15 19  ALT 14 18  ALKPHOS 50 50  BILITOT 0.5 1.1  PROT 7.2 8.1  ALBUMIN 4.1 4.4   Recent Labs  Lab 03/28/18 1853  LIPASE 23   No results for input(s): AMMONIA in the last 168 hours. CBC: Recent Labs  Lab 03/24/18 1756 03/28/18 1853 03/30/18 0621 03/31/18 0602  WBC 6.7 7.7 5.5 5.6  NEUTROABS 3.3  --   --   --   HGB 12.9 14.0 11.9* 12.0  HCT 38.9 41.0 36.1 35.4*  MCV 92.6 91.9 93.0 91.2  PLT 211 176 132* 131*   Cardiac Enzymes: Recent Labs  Lab 03/28/18 1853  TROPONINI <0.03   BNP: BNP (last 3 results) No results for input(s): BNP in the last 8760 hours.  ProBNP (last 3 results) No results for input(s): PROBNP in the last 8760 hours.  CBG: No results for input(s): GLUCAP in the last 168 hours.     Signed:  Ramiro Harvest MD.  [facility] 03/31/2018, 3:59 PM

## 2018-03-31 NOTE — Progress Notes (Signed)
HH orders for pt at ALF received. Pt offered choice and [facility] chosen. [facility] rep alerted of referral. Sandford Craze RN,BSN,NCM [phone]

## 2018-03-31 NOTE — Discharge Summary (Deleted)
Physician Discharge Summary  Emma [name] WUJ:[id number] DOB: 1932/03/15 DOA: 03/28/2018  PCP: Diamantina Providence, FNP  Admit date: 03/28/2018 Discharge date: 03/31/2018  Time spent: 55 minutes  Recommendations for Outpatient Follow-up:  1. Follow-up with Diamantina Providence, FNP in 1 week.  On follow-up patient is urinary retention will need to be reassessed and patient will need a voiding trial.  Patient will also need a basic metabolic profile done to follow-up on electrolytes and renal function.  Patient's ACE inhibitor was discontinued during this hospitalization due to acute kidney injury.  Patient's blood pressure need to be reassessed.   Discharge Diagnoses:  Principal Problem:   Nausea vomiting and diarrhea Active Problems:   Infiltrate of lower lobe of left lung present on imaging study   Dementia   HTN (hypertension)   AKI (acute kidney injury) (HCC)   Community acquired pneumonia of left lower lobe of lung (HCC)   Acute urinary retention   Discharge Condition: Stable and improved  Diet recommendation: Heart healthy  Filed Weights   03/28/18 2222  Weight: 75.2 kg (165 lb 12.6 oz)    History of present illness:  Per Dr. Dominica Severin is a 84 y.o. female with medical history significant of Alzheimer's dementia, HTN, RA, CAD.  Patient presented to the ED with c/o nausea and vomiting that began2 days prior to admission. Pt stated she had 3 episodes of vomiting. Denied hematemesis. She also reported diarrhea for the same time period. Estimated about 3 episodes of diarrhea. She was unsure if she has had blood in her stool.   Pt initially denied chest pain. On further questioning she reported has a dull, "light pain" to the middle of her chestthat she estimates began around 3:00pm. States pain was nonradiating. She also reported sweats and chills, but denied shortness of breath, cough, or abd pain. Pt [facility] at [facility].   Per patients daughter: patient started  having N/V/D x2 days ago.  Speech seemed slow and patient confused over past 2 days.  Denied slurred speech.  Pts daughter stated that she has seen worms in pts stool a year ago and was concerned. Normally oriented to day of the week, not necessarily to date. Daughter stated that norovirus was going around assisted living facility.  Per facility: N/V/D, given 2 imodium for diarrhea, fever 102.   ED Course: Labs show AKI.  UA neg.  Given 1L NS, zofran.  CXR shows LLL infiltrate.  Hospitalist asked to admit.    Hospital Course:  #1 nausea vomiting diarrhea Likely secondary to a viral gastroenteritis.  Patient with no further diarrhea nausea vomiting during the hospitalization.  Patient was subsequently started on a clear liquid diet which she tolerated.  Diet was subsequently advanced to a heart healthy diet which he tolerated.  Patient was initially hydrated with IV fluids and IV fluids subsequently discontinued.  Patient improved clinically and was back to baseline by day of discharge.  Outpatient follow-up.  2.  Community acquired pneumonia Patient from assisted living facility presented with nausea vomiting and diarrhea.  Chest x-ray concerning for left lower lobe infiltrate.  Patient remained afebrile.  Patient was pancultured with no growth to date.  Urine strep pneumococcus antigen was negative.  Patient was placed empirically on IV Rocephin and IV azithromycin.  Patient improved clinically.  Patient subsequently transitioned to oral Augmentin and will be discharged home on 4 more days of oral Augmentin to complete a one-week course of antibiotic treatment.  Outpatient follow-up.  3.  Dementia Stable.  Patient maintained on home regimen of Risperdal.  4.  Acute metabolic encephalopathy Likely secondary to #1 and #2.  Patient with no further diarrhea and as such unable to get a GI pathogen panel.  Patient was started empirically on IV antibiotics for community-acquired pneumonia.   Patient was hydrated with IV fluids.  Patient improved clinically and was back to baseline by day of discharge.  Outpatient follow-up.  5.  Hypertension Patient is ACE inhibitor and diuretic were held on admission secondary to acute kidney injury.  Patient was maintained on home regimen of Norvasc 5 mg daily which was uptitrated to 10 mg daily for better blood pressure control.  Patient's HCTZ was subsequently resumed.  Patient's lisinopril was discontinued and will not be resumed on discharge.  Outpatient follow-up.   6.  Acute kidney injury Likely secondary to prerenal azotemia in the setting of ACE inhibitor.  ACE inhibitor was discontinued during the hospitalization.  HCTZ was held.  Patient was hydrated with IV fluids with resolution of acute kidney injury.  Patient's ACE inhibitor will not be resumed on discharge.  Outpatient follow-up.   7.  Hypokalemia Repleted.  Follow.  8 urinary retention Patient was noted to have urinary retention during the hospitalization.  Foley catheter was placed and patient started on Flomax.  Patient with good urine output.  Patient will be discharged to assisted living facility with Foley catheter.  Patient will need a voiding trial either at the facility in about 4-5 days or follow-up with PCP in 1 week.    Procedures:  CT head 03/28/2018,  Chest x-ray 03/28/2018      Consultations:  None  Discharge Exam: Vitals:   03/31/18 0458 03/31/18 1549  BP: (!) 144/59 (!) 159/69  Pulse: 73 75  Resp: 18 20  Temp: 98.1 F (36.7 C) 98.1 F (36.7 C)  SpO2: 98% 98%    General: NAD Cardiovascular: RRR Respiratory: CTAB  Discharge Instructions   Discharge Instructions    Diet - low sodium heart healthy   Complete by:  As directed    Increase activity slowly   Complete by:  As directed       No Known Allergies Follow-up Information    Diamantina Providence, FNP. Schedule an appointment as soon as possible for a visit in 1 week(s).    Specialty:  Nurse Practitioner Why:  f/u in 1 week for HFU and voiding trial Contact information: [address] Ginette [place] [address] [phone]            The results of significant diagnostics from this hospitalization (including imaging, microbiology, ancillary and laboratory) are listed below for reference.    Significant Diagnostic Studies: Dg Chest 2 View  Result Date: 03/28/2018 CLINICAL DATA:  Chest pain EXAM: CHEST - 2 VIEW COMPARISON:  03/24/2018 FINDINGS: Left lower lobe infiltrate with probable small left effusion. Right lung is clear. Normal heart size. No pneumothorax. IMPRESSION: Left lower lobe infiltrate with probable adjacent small effusion. Radiographic follow-up to resolution recommended. Electronically Signed   By: Jasmine Pang M.D.   On: 03/28/2018 20:06   Dg Chest 2 View  Result Date: 03/24/2018 CLINICAL DATA:  84 year old female with chest pain. Left side neurologic deficits. Possible stroke. EXAM: CHEST - 2 VIEW COMPARISON:  Chest radiographs 11/15/2017. FINDINGS: Upright AP and lateral views of the chest. Lung volumes are stable and within normal limits. Stable mild cardiomegaly and mediastinal contours. Visualized tracheal air column is within normal  limits. Both lungs appear clear. No pneumothorax. No pleural effusion is evident. No acute osseous abnormality identified. Negative visible bowel gas pattern. IMPRESSION: No acute cardiopulmonary abnormality. Electronically Signed   By: Odessa Fleming M.D.   On: 03/24/2018 18:56   Ct Head Wo Contrast  Result Date: 03/28/2018 CLINICAL DATA:  Altered mental status EXAM: CT HEAD WITHOUT CONTRAST TECHNIQUE: Contiguous axial images were obtained from the base of the skull through the vertex without intravenous contrast. COMPARISON:  03/24/2018 FINDINGS: Brain: No evidence of acute infarction, hemorrhage, hydrocephalus, extra-axial collection or mass lesion/mass effect. Mild small vessel ischemic changes of the  white matter and atrophy. Vascular: Carotid vascular calcification.  No hyperdense vessel Skull: No acute abnormality Sinuses/Orbits: Mucosal thickening in the ethmoid and sphenoid sinuses. No acute orbital abnormality. Other: None IMPRESSION: 1. No CT evidence for acute intracranial abnormality. 2. Atrophy and mild small vessel ischemic changes of the white matter Electronically Signed   By: Jasmine Pang M.D.   On: 03/28/2018 20:11   Ct Head Wo Contrast  Result Date: 03/24/2018 CLINICAL DATA:  Left-sided headache radiating down the left side of the neck EXAM: CT HEAD WITHOUT CONTRAST TECHNIQUE: Contiguous axial images were obtained from the base of the skull through the vertex without intravenous contrast. COMPARISON:  None. FINDINGS: Brain: No evidence of accelerated atrophy. No sign of old or acute focal small or large vessel infarction. No mass lesion, hemorrhage, hydrocephalus or extra-axial collection. Vascular: There is atherosclerotic calcification of the major vessels at the base of the brain. Skull: Normal Sinuses/Orbits: Mild mucosal inflammatory changes of the paranasal sinuses, frequently seen and often subclinical. Other: None IMPRESSION: No cause of the presenting symptoms is identified. Normal appearance of the brain for age. Ordinary atherosclerotic calcification of the major vessels at the base the brain. Mild sinus mucosal thickening. Electronically Signed   By: Paulina Fusi M.D.   On: 03/24/2018 19:26    Microbiology: Recent Results (from the past 240 hour(s))  Culture, blood (routine x 2) Call MD if unable to obtain prior to antibiotics being given     Status: None (Preliminary result)   Collection Time: 03/28/18  9:01 PM  Result Value Ref Range Status   Specimen Description   Final    BLOOD LEFT HAND Performed at [facility], [address], [place], [place] [address]    Special Requests   Final    BOTTLES DRAWN AEROBIC AND ANAEROBIC Blood Culture results  may not be optimal due to an inadequate volume of blood received in culture bottles Performed at [facility], [address], [place], [place] [address]    Culture   Final    NO GROWTH 3 DAYS Performed at [facility], [address] [place], [place] [address]    Report Status PENDING  Incomplete  Culture, blood (routine x 2) Call MD if unable to obtain prior to antibiotics being given     Status: None (Preliminary result)   Collection Time: 03/28/18  9:02 PM  Result Value Ref Range Status   Specimen Description   Final    BLOOD RIGHT HAND Performed at [facility], [address] [place], [place] [address]    Special Requests   Final    BOTTLES DRAWN AEROBIC ONLY Blood Culture adequate volume Performed at [facility], [address], [place], [place] [address]    Culture   Final    NO GROWTH 3 DAYS Performed at [facility], [address], [place], [place] [address]    Report Status PENDING  Incomplete  MRSA PCR Screening     Status: None   Collection Time: 03/29/18  5:30 AM  Result Value Ref Range Status   MRSA by PCR NEGATIVE NEGATIVE Final    Comment:        The GeneXpert MRSA Assay (FDA approved for NASAL specimens only), is one component of a comprehensive MRSA colonization surveillance program. It is not intended to diagnose MRSA infection nor to guide or monitor treatment for MRSA infections. Performed at [facility], [address] [place], [place] [address]   Urine culture     Status: None   Collection Time: 03/29/18 10:50 AM  Result Value Ref Range Status   Specimen Description   Final    URINE, CLEAN CATCH Performed at [facility], [address] [place], [place] [address]    Special Requests   Final    NONE Performed at [facility], [address] [place], [place] [address]    Culture   Final    NO GROWTH Performed  at [facility], [address], [place], [place] [address]    Report Status 03/30/2018 FINAL  Final     Labs: Basic Metabolic Panel: Recent Labs  Lab 03/24/18 1756 03/28/18 1853 03/30/18 0621 03/31/18 0602  NA 136 138 141 142  K 4.0 3.2* 3.5 3.3*  CL 102 105 112* 112*  CO2 25 21* 21* 23  GLUCOSE 104* 129* 96 96  BUN 8 30* 8 <5*  CREATININE 0.74 1.54* 0.57 0.54  CALCIUM 10.0 9.7 8.7* 9.0  MG  --   --   --  1.7   Liver Function Tests: Recent Labs  Lab 03/24/18 1756 03/28/18 1853  AST 15 19  ALT 14 18  ALKPHOS 50 50  BILITOT 0.5 1.1  PROT 7.2 8.1  ALBUMIN 4.1 4.4   Recent Labs  Lab 03/28/18 1853  LIPASE 23   No results for input(s): AMMONIA in the last 168 hours. CBC: Recent Labs  Lab 03/24/18 1756 03/28/18 1853 03/30/18 0621 03/31/18 0602  WBC 6.7 7.7 5.5 5.6  NEUTROABS 3.3  --   --   --   HGB 12.9 14.0 11.9* 12.0  HCT 38.9 41.0 36.1 35.4*  MCV 92.6 91.9 93.0 91.2  PLT 211 176 132* 131*   Cardiac Enzymes: Recent Labs  Lab 03/28/18 1853  TROPONINI <0.03   BNP: BNP (last 3 results) No results for input(s): BNP in the last 8760 hours.  ProBNP (last 3 results) No results for input(s): PROBNP in the last 8760 hours.  CBG: No results for input(s): GLUCAP in the last 168 hours.     Signed:  Ramiro Harvest MD.  [facility] 03/31/2018, 3:58 PM

## 2018-03-31 NOTE — Progress Notes (Signed)
Called [facility]. Gave report to [employer]

## 2018-03-31 NOTE — NC FL2 (Signed)
[place] MEDICAID FL2 LEVEL OF CARE SCREENING TOOL     IDENTIFICATION  Patient Name: Emma [name] Birthdate: 1932/06/26 Sex: female Admission Date (Current Location): 03/28/2018  [facility] and [place] Number:  Producer, television/film/video and Address:  [facility],  [address] [place], [place] [address]      Provider Number: [id number]  Attending Physician Name and Address:  Rodolph Bong, MD  Relative Name and Phone Number:       Current Level of Care: Hospital Recommended Level of Care: Assisted Living Facility Prior Approval Number:    Date Approved/Denied:   PASRR Number:    Discharge Plan: Other (Comment)(ALF)    Current Diagnoses: Patient Active Problem List   Diagnosis Date Noted  . Acute urinary retention 03/31/2018  . Community acquired pneumonia of left lower lobe of lung (HCC)   . Nausea vomiting and diarrhea 03/28/2018  . Infiltrate of lower lobe of left lung present on imaging study 03/28/2018  . Dementia 03/28/2018  . HTN (hypertension) 03/28/2018  . AKI (acute kidney injury) (HCC) 03/28/2018  . Paranoid (HCC)     Orientation RESPIRATION BLADDER Height & Weight     Self, Time  Normal Continent Weight: 165 lb 12.6 oz (75.2 kg) Height:  5\' 10"  (177.8 cm)  BEHAVIORAL SYMPTOMS/MOOD NEUROLOGICAL BOWEL NUTRITION STATUS      Continent Diet(See dc summary)  AMBULATORY STATUS COMMUNICATION OF NEEDS Skin   Independent Verbally Normal                       Personal Care Assistance Level of Assistance  Bathing, Feeding, Dressing Bathing Assistance: Limited assistance Feeding assistance: Independent Dressing Assistance: Limited assistance     Functional Limitations Info  Hearing, Sight, Speech Sight Info: Adequate Hearing Info: Adequate Speech Info: Adequate    SPECIAL CARE FACTORS FREQUENCY                       Contractures Contractures Info: Not present    Additional Factors Info  Code Status, Allergies Code Status Info:  Full Allergies Info: NKA           Current Medications (03/31/2018):  This is the current hospital active medication list Current Facility-Administered Medications  Medication Dose Route Frequency Provider Last Rate Last Dose  . acetaminophen (TYLENOL) tablet 650 mg  650 mg Oral Q6H PRN 05/31/2018 N, DO      . amLODipine (NORVASC) tablet 10 mg  10 mg Oral Daily Dow Adolph, MD   10 mg at 03/31/18 0909  . amoxicillin-clavulanate (AUGMENTIN) 875-125 MG per tablet 1 tablet  1 tablet Oral Q12H 0910, MD   1 tablet at 03/31/18 1219  . benztropine (COGENTIN) tablet 0.25 mg  0.25 mg Oral BID 1220, RPH   0.25 mg at 03/31/18 0909  . chlorhexidine (PERIDEX) 0.12 % solution 15 mL  15 mL Mouth Rinse BID 0910 M, DO   15 mL at 03/31/18 0909  . cholecalciferol (VITAMIN D) tablet 1,000 Units  1,000 Units Oral Daily 0910, DO   1,000 Units at 03/31/18 0909  . enoxaparin (LOVENOX) injection 40 mg  40 mg Subcutaneous QHS 0910, MD   40 mg at 03/30/18 2114  . feeding supplement (BOOST / RESOURCE BREEZE) liquid 1 Container  1 Container Oral BID BM 2115 N, DO   1 Container at 03/31/18 1220  . hydrochlorothiazide (MICROZIDE) capsule 12.5 mg  12.5 mg Oral Daily Rodolph Bong, MD   12.5 mg at 03/31/18 1220  . latanoprost (XALATAN) 0.005 % ophthalmic solution 1 drop  1 drop Both Eyes QHS Lyda Perone M, DO   1 drop at 03/30/18 2113  . MEDLINE mouth rinse  15 mL Mouth Rinse q12n4p Hillary Bow, DO   15 mL at 03/29/18 1238  . pantoprazole (PROTONIX) EC tablet 20 mg  20 mg Oral Once Couture, Cortni S, PA-C   Stopped at 03/28/18 2103  . pantoprazole (PROTONIX) EC tablet 40 mg  40 mg Oral Q0600 Rodolph Bong, MD      . risperiDONE (RISPERDAL) tablet 1 mg  1 mg Oral Daily Lyda Perone M, DO   1 mg at 03/31/18 0909  . risperiDONE (RISPERDAL) tablet 2 mg  2 mg Oral QHS Lyda Perone M, DO   2 mg at 03/30/18 2114  . tamsulosin (FLOMAX)  capsule 0.4 mg  0.4 mg Oral Daily Rodolph Bong, MD   0.4 mg at 03/31/18 0909  . traZODone (DESYREL) tablet 50 mg  50 mg Oral QHS PRN Hillary Bow, DO      . vitamin A capsule 10,000 Units  10,000 Units Oral Daily Hillary Bow, DO   10,000 Units at 03/31/18 0909  . vitamin B-12 (CYANOCOBALAMIN) tablet 100 mcg  100 mcg Oral Daily Lyda Perone M, DO   100 mcg at 03/31/18 0909     Discharge Medications: TAKE these medications   amLODipine 10 MG tablet Commonly known as:  NORVASC Take 1 tablet (10 mg total) by mouth daily. What changed:    medication strength  how much to take   amoxicillin-clavulanate 875-125 MG tablet Commonly known as:  AUGMENTIN Take 1 tablet by mouth every 12 (twelve) hours for 4 days.   benztropine 0.5 MG tablet Commonly known as:  COGENTIN Take 0.5 tablets by mouth 2 (two) times daily.   cholecalciferol 1000 units tablet Commonly known as:  VITAMIN D Take 1,000 Units by mouth daily.   hydrochlorothiazide 12.5 MG capsule Commonly known as:  MICROZIDE Take 12.5 capsules by mouth daily.   pantoprazole 40 MG tablet Commonly known as:  PROTONIX Take 1 tablet (40 mg total) by mouth daily at 6 (six) AM.   risperiDONE 1 MG tablet Commonly known as:  RISPERDAL Take 1 mg by mouth daily.   risperiDONE 2 MG tablet Commonly known as:  RISPERDAL Take 2 mg by mouth daily. At bedtime   tamsulosin 0.4 MG Caps capsule Commonly known as:  FLOMAX Take 1 capsule (0.4 mg total) by mouth daily. Start taking on:  04/01/2018   TRAVATAN Z 0.004 % Soln ophthalmic solution Generic drug:  Travoprost (BAK Free) Place 1 drop into both eyes at bedtime.   traZODone 50 MG tablet Commonly known as:  DESYREL Take 50 mg by mouth at bedtime as needed for sleep.   vitamin A 85885 UNIT capsule Take 10,000 Units by mouth daily.   vitamin B-12 100 MCG tablet Commonly known as:  CYANOCOBALAMIN Take 100 mcg by mouth daily.   vitamin C 500 MG  tablet Commonly known as:  ASCORBIC ACID Take 500 mg by mouth daily.      Relevant Imaging Results:  Relevant Lab Results:   Additional Information ssn: [id number]  Nelwyn Salisbury, LCSW

## 2018-03-31 NOTE — Evaluation (Signed)
Physical Therapy Evaluation Patient Details Name: Rashad Obeid MRN: [id number] DOB: [birth year] Today's Date: 03/31/2018   History of Present Illness  84 yo female admitted with N/V/D. Hx of dementia, RA, CAD  Clinical Impression  On evall, pt was Min guard assist for mobility. She walked ~120 feet (60 feet with a RW, 60 feet with a cane). Pt participated well. Dyspnea 2/4, O2 sat 100% on RA, HR 99 bpm after walking. Recommend HHPT f/u. Pt is from an ALF. Per chart, family wants pt to return to ALF.     Follow Up Recommendations Home health PT (at ALF)    Equipment Recommendations  None recommended by PT    Recommendations for Other Services       Precautions / Restrictions Precautions Precautions: Fall Restrictions Weight Bearing Restrictions: No      Mobility  Bed Mobility               General bed mobility comments: oob in recliner  Transfers Overall transfer level: Needs assistance   Transfers: Sit to/from Stand Sit to Stand: Min guard         General transfer comment: close guard for safety. VCs hand placement. Took pt multiple attempts to get to standing.   Ambulation/Gait Ambulation/Gait assistance: Min guard Ambulation Distance (Feet): 120 Feet Assistive device: Rolling walker (2 wheeled);Straight cane Gait Pattern/deviations: Step-through pattern;Narrow base of support     General Gait Details: 60 feet with RW x 1, 60 feet with cane x 1. Close guard for safety. Dyspnea 2/4. O2 sats 100% on RA, HR 99 bpm after ambulation.   Stairs            Wheelchair Mobility    Modified Rankin (Stroke Patients Only)       Balance Overall balance assessment: Needs assistance         Standing balance support: Single extremity supported Standing balance-Leahy Scale: Poor                               Pertinent Vitals/Pain Pain Assessment: No/denies pain    Home Living Family/patient expects to be discharged to:: Assisted living      Type of Home: Assisted living Home Access: Level entry     Home Layout: One level Home Equipment: Cane - single point      Prior Function Level of Independence: Independent with assistive device(s)         Comments: uses cane at baseline     Hand Dominance        Extremity/Trunk Assessment   Upper Extremity Assessment Upper Extremity Assessment: Generalized weakness    Lower Extremity Assessment Lower Extremity Assessment: Generalized weakness    Cervical / Trunk Assessment Cervical / Trunk Assessment: Kyphotic  Communication   Communication: No difficulties  Cognition Arousal/Alertness: Awake/alert Behavior During Therapy: WFL for tasks assessed/performed Overall Cognitive Status: History of cognitive impairments - at baseline                                        General Comments      Exercises     Assessment/Plan    PT Assessment Patient needs continued PT services  PT Problem List Decreased mobility;Decreased balance;Decreased cognition       PT Treatment Interventions DME instruction;Gait training;Therapeutic activities;Therapeutic exercise;Patient/family education;Functional mobility training    PT Goals (Current  goals can be found in the Care Plan section)  Acute Rehab PT Goals Patient Stated Goal: none stated PT Goal Formulation: With patient Time For Goal Achievement: 04/14/18 Potential to Achieve Goals: Fair    Frequency Min 3X/week   Barriers to discharge        Co-evaluation               AM-PAC PT "6 Clicks" Daily Activity  Outcome Measure Difficulty turning over in bed (including adjusting bedclothes, sheets and blankets)?: A Lot Difficulty moving from lying on back to sitting on the side of the bed? : Unable Difficulty sitting down on and standing up from a chair with arms (e.g., wheelchair, bedside commode, etc,.)?: Unable Help needed moving to and from a bed to chair (including a wheelchair)?: A  Little Help needed walking in hospital room?: A Little Help needed climbing 3-5 steps with a railing? : A Lot 6 Click Score: 12    End of Session Equipment Utilized During Treatment: Gait belt Activity Tolerance: Patient tolerated treatment well Patient left: in chair;with call bell/phone within reach;with chair alarm set;with family/visitor present   PT Visit Diagnosis: Muscle weakness (generalized) (M62.81);Difficulty in walking, not elsewhere classified (R26.2)    Time: 1424-1440 PT Time Calculation (min) (ACUTE ONLY): 16 min   Charges:   PT Evaluation $PT Eval Moderate Complexity: 1 Mod     PT G Codes:          Rebeca Alert, MPT Pager: [phone]

## 2018-03-31 NOTE — Progress Notes (Signed)
Patient returning to [facility].  LCSW confirmed return with facility.   Patient's daughter notified.   Patient will transport by [facility].  LCSW faxed dc docs to facility.  RN report#: [phone]  Coralyn Helling, [place] Long CSW [phone]

## 2018-03-31 NOTE — Progress Notes (Signed)
LCSW confirmed with ALF that patient can return with [facility].  Facility is agreeable to patient return with [facility].   LCSW will continue to follow.   Beulah Gandy [place] Long CSW [phone]

## 2018-04-02 LAB — CULTURE, BLOOD (ROUTINE X 2)
CULTURE: NO GROWTH
CULTURE: NO GROWTH
Special Requests: ADEQUATE

## 2019-02-15 ENCOUNTER — Emergency Department (HOSPITAL_COMMUNITY)
Admission: EM | Admit: 2019-02-15 | Discharge: 2019-02-15 | Disposition: A | Discharge status: Home / Self Care | Payer: Medicare HMO | Attending: Emergency Medicine | Admitting: Emergency Medicine

## 2019-02-15 ENCOUNTER — Encounter (HOSPITAL_COMMUNITY): Payer: Self-pay

## 2019-02-15 ENCOUNTER — Other Ambulatory Visit: Payer: Self-pay

## 2019-02-15 DIAGNOSIS — Z79899 Other long term (current) drug therapy: Secondary | ICD-10-CM | POA: Insufficient documentation

## 2019-02-15 DIAGNOSIS — M069 Rheumatoid arthritis, unspecified: Secondary | ICD-10-CM | POA: Diagnosis not present

## 2019-02-15 DIAGNOSIS — N814 Uterovaginal prolapse, unspecified: Secondary | ICD-10-CM | POA: Insufficient documentation

## 2019-02-15 DIAGNOSIS — I10 Essential (primary) hypertension: Secondary | ICD-10-CM | POA: Diagnosis not present

## 2019-02-15 DIAGNOSIS — F0390 Unspecified dementia without behavioral disturbance: Secondary | ICD-10-CM | POA: Insufficient documentation

## 2019-02-15 DIAGNOSIS — I2510 Atherosclerotic heart disease of native coronary artery without angina pectoris: Secondary | ICD-10-CM | POA: Insufficient documentation

## 2019-02-15 DIAGNOSIS — R102 Pelvic and perineal pain: Secondary | ICD-10-CM | POA: Diagnosis present

## 2019-02-15 NOTE — ED Notes (Signed)
Bed: WA02 Expected date:  Expected time:  Means of arrival:  Comments: EMS, Prolapsed Bladder from SNF

## 2019-02-15 NOTE — ED Provider Notes (Signed)
[facility]-EMERGENCY DEPT Provider Note   CSN: [id number] Arrival date & time: 02/15/19  1559    History   Chief Complaint Chief Complaint  Patient presents with  . Vaginal Pain    HPI Darrol JumpDaisy [name] is a 85 y.o. female.     HPI Pt states she has had something coming out of her vagina for the last month or two.  She thinks it could be her uterus.  Pt had not mentioned it to anyone.  She resides at a nursing facility and they noticed it today.  She was sent for evaluation.  She denies any fevers or chills.  No pain.  No vaginal bleeding. Past Medical History:  Diagnosis Date  . Coronary artery disease   . Dementia (HCC)   . Hypertension   . RA (rheumatoid arthritis) [facility](HCC)     Patient Active Problem List   Diagnosis Date Noted  . Acute urinary retention 03/31/2018  . Community acquired pneumonia of left lower lobe of lung (HCC)   . Nausea vomiting and diarrhea 03/28/2018  . Infiltrate of lower lobe of left lung present on imaging study 03/28/2018  . Dementia (HCC) 03/28/2018  . HTN (hypertension) 03/28/2018  . AKI (acute kidney injury) (HCC) 03/28/2018  . Paranoid [facility](HCC)     Past Surgical History:  Procedure Laterality Date  . stents       OB History   No obstetric history on file.      Home Medications    Prior to Admission medications   Medication Sig Start Date End Date Taking? Authorizing Provider  benztropine (COGENTIN) 0.5 MG tablet Take 0.5 tablets by mouth 2 (two) times daily. 11/08/17  Yes [provider]  cholecalciferol (VITAMIN D) 1000 units tablet Take 1,000 Units by mouth daily.   Yes [provider]  hydrochlorothiazide (MICROZIDE) 12.5 MG capsule Take 12.5 mg by mouth daily.  11/08/17  Yes [provider]  pantoprazole (PROTONIX) 40 MG tablet Take 1 tablet (40 mg total) by mouth daily at 6 (six) AM. 03/31/18  Yes Rodolph Bonghompson, Daniel V, MD  TRAVATAN Z 0.004 % SOLN ophthalmic solution Place 1 drop into  both eyes at bedtime. 08/12/17  Yes [provider]  vitamin A [id number] UNIT capsule Take 10,000 Units by mouth daily.   Yes [provider]  vitamin C (ASCORBIC ACID) 500 MG tablet Take 500 mg by mouth daily.   Yes [provider]  amLODipine (NORVASC) 10 MG tablet Take 1 tablet (10 mg total) by mouth daily. Patient not taking: Reported on 02/15/2019 03/31/18   Rodolph Bonghompson, Daniel V, MD  tamsulosin (FLOMAX) 0.4 MG CAPS capsule Take 1 capsule (0.4 mg total) by mouth daily. Patient not taking: Reported on 02/15/2019 04/01/18   Rodolph Bonghompson, Daniel V, MD    Family History History reviewed. No pertinent family history.  Social History Social History   Tobacco Use  . Smoking status: Never Smoker  . Smokeless tobacco: Never Used  Substance Use Topics  . Alcohol use: No  . Drug use: Not on file     Allergies   Patient has no known allergies.   Review of Systems Review of Systems  All other systems reviewed and are negative.    Physical Exam Updated Vital Signs BP (!) 174/71 (BP Location: Right Arm)   Pulse 66   Temp 97.6 F (36.4 C) (Oral)   Resp 16   SpO2 100%   Physical Exam Vitals signs and nursing note reviewed.  Constitutional:  General: She is not in acute distress.    Appearance: She is well-developed.  HENT:     Head: Normocephalic and atraumatic.     Right Ear: External ear normal.     Left Ear: External ear normal.  Eyes:     General: No scleral icterus.       Right eye: No discharge.        Left eye: No discharge.     Conjunctiva/sclera: Conjunctivae normal.  Neck:     Musculoskeletal: Neck supple.     Trachea: No tracheal deviation.  Cardiovascular:     Rate and Rhythm: Normal rate.  Pulmonary:     Effort: Pulmonary effort is normal. No respiratory distress.     Breath sounds: No stridor.  Abdominal:     General: There is no distension.  Musculoskeletal:        General: No swelling or deformity.  Skin:    General: Skin is warm  and dry.     Findings: No rash.  Neurological:     Mental Status: She is alert.     Cranial Nerves: Cranial nerve deficit: no gross deficits.      ED Treatments / Results   Procedures Procedures (including critical care time)  Medications Ordered in ED Medications - No data to display   Initial Impression / Assessment and Plan / ED Course  I have reviewed the triage vital signs and the nursing notes.  Pertinent labs & imaging results that were available during my care of the patient were reviewed by me and considered in my medical decision making (see chart for details).   On exam the patient has evidence of cystocele most likely related to prolapsed uterus.  Patient is not having any bleeding.  No prolapse through the vaginal orifice.  No erythema or evidence of irritation to the vaginal tissue.  Patient is stable to follow-up with a gynecologist or her primary care doctor.  Final Clinical Impressions(s) / ED Diagnoses   Final diagnoses:  Cystocele with uterine prolapse    ED Discharge Orders    None       Linwood Dibbles, MD 02/15/19 1810

## 2019-02-15 NOTE — Discharge Instructions (Signed)
Follow up with a gynecologist or see your primary care doctor to discuss further treatment and evaluation

## 2019-02-15 NOTE — ED Triage Notes (Signed)
Pt BIB EMS from [facility]. Facility reports they noticed today that the patient has a prolapsed uterus. Pt reports that she know about it for awhile but was hiding it from staff. Pt has hx of dementia but A&O x4 right now.   170/100 HR 68 100%

## 2019-02-24 IMAGING — CT CT HEAD W/O CM
3 series · 15 of 46 positions shown, 18 images · non-contrast
Comparison: 03/24/2018

CLINICAL DATA: Altered mental status

EXAM:
CT HEAD WITHOUT CONTRAST
TECHNIQUE: Contiguous axial images were obtained from the base of the skull
through the vertex without intravenous contrast.

[Series 2: head wo · axial · 0.43mm/px · z∈[+1519,+1639]mm · 9 of 29 slices shown, 12 images]
[im 3/29  brain]
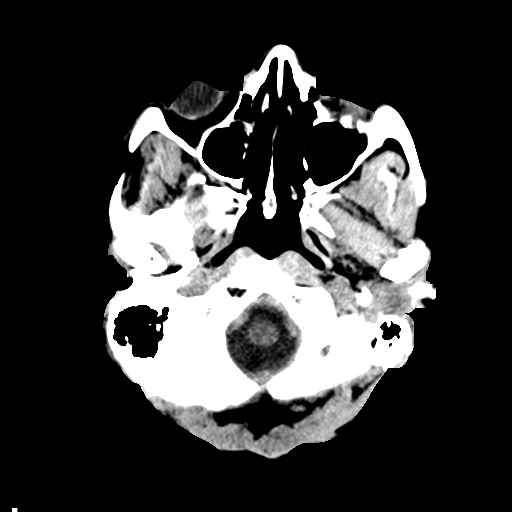
[im 3/29  bone]
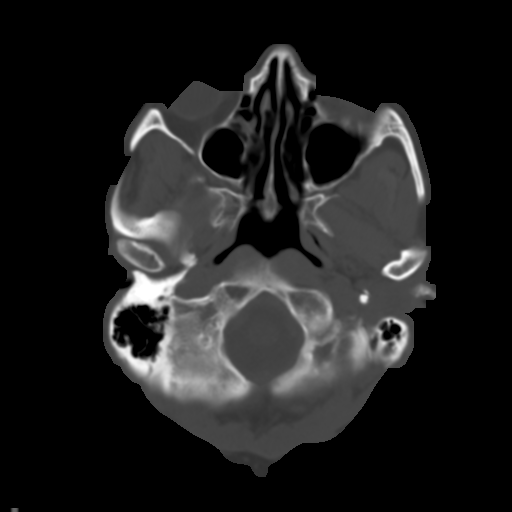
[im 6/29  brain]
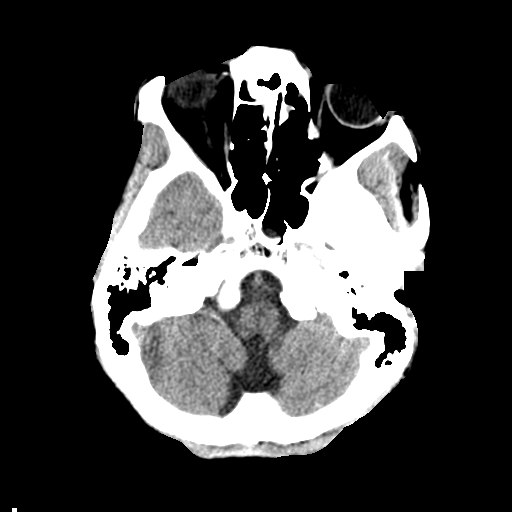
[im 9/29  brain]
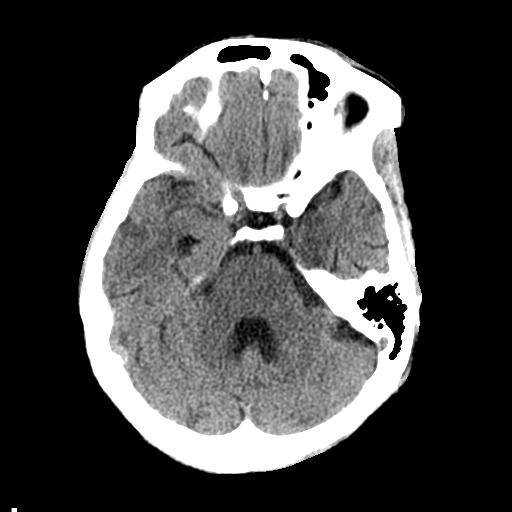
[im 12/29  brain]
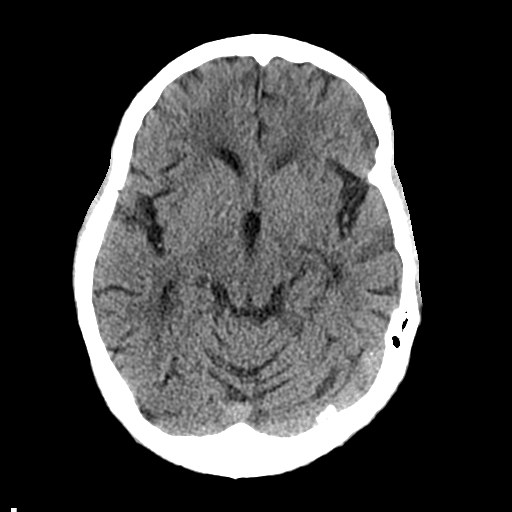
[im 15/29  brain]
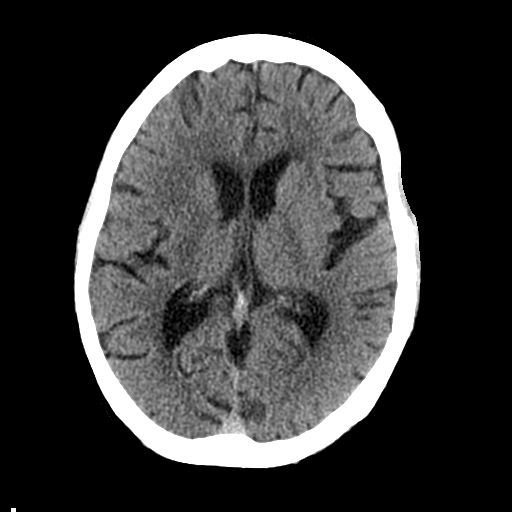
[im 15/29  bone]
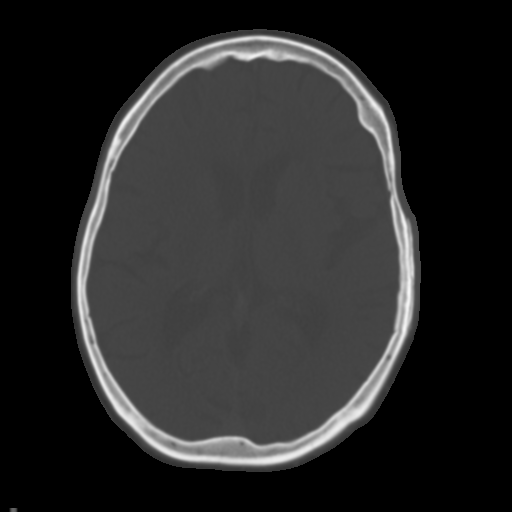
[im 18/29  brain]
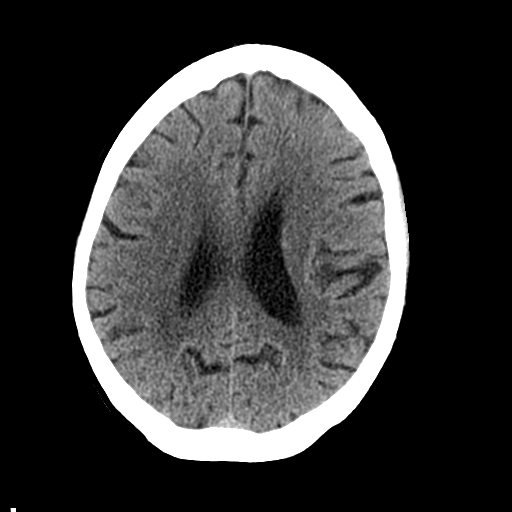
[im 21/29  brain]
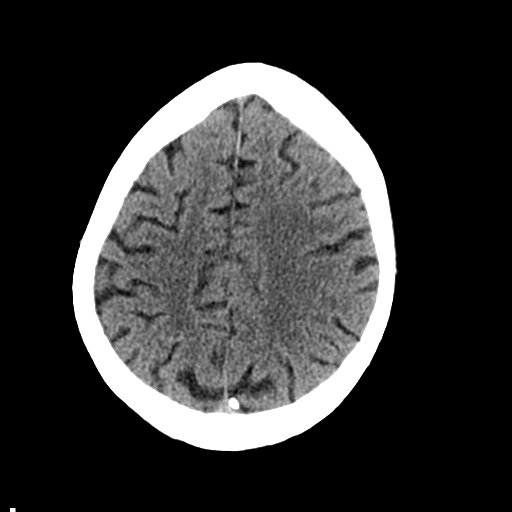
[im 24/29  brain]
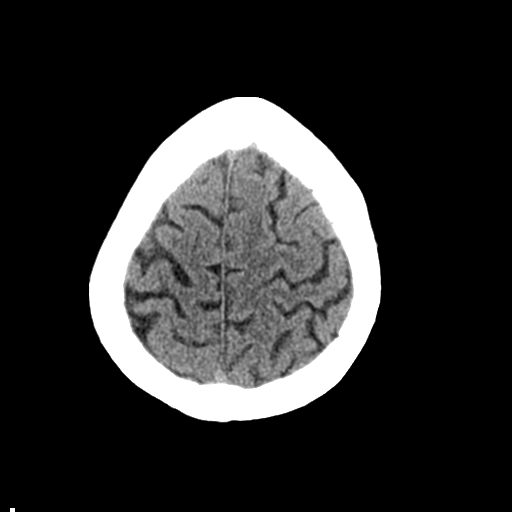
[im 27/29  brain]
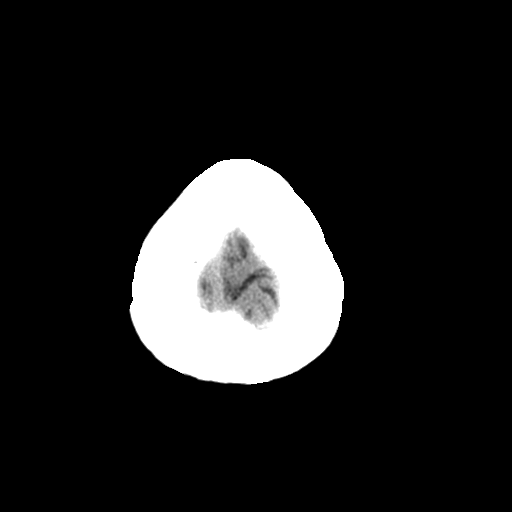
[im 27/29  bone]
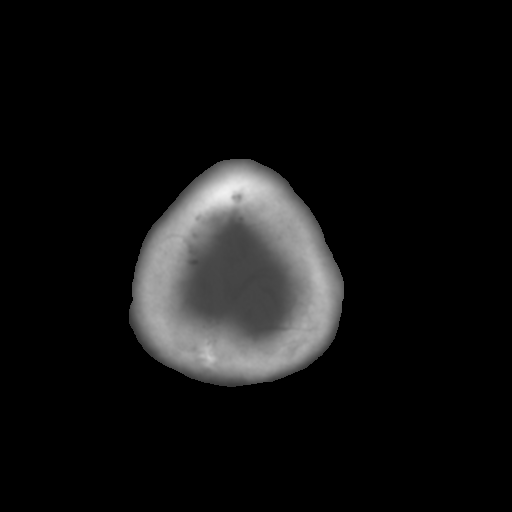

[Series 5: coronal soft tissue · coronal · 0.30mm/px · 3 of 68 slices shown]
[im 23/68  brain]
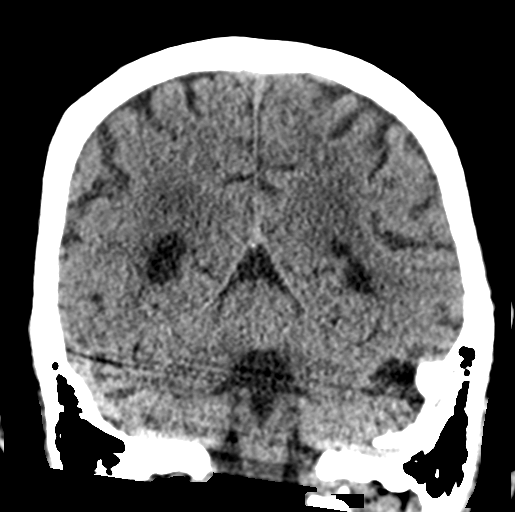
[im 30/68  brain]
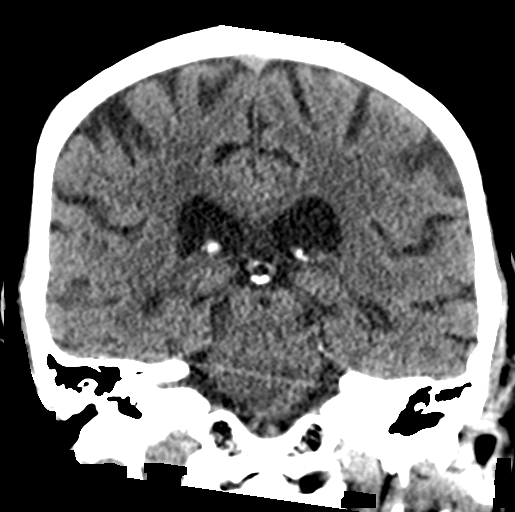
[im 38/68  brain]
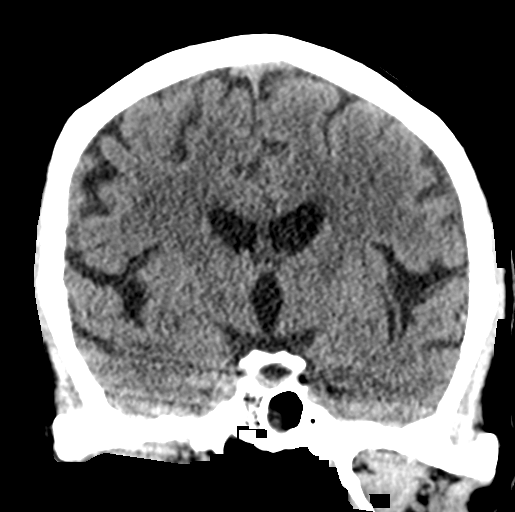

[Series 6: sagittal soft tissue · sagittal · 0.30mm/px · 3 of 54 slices shown]
[im 20/54  brain]
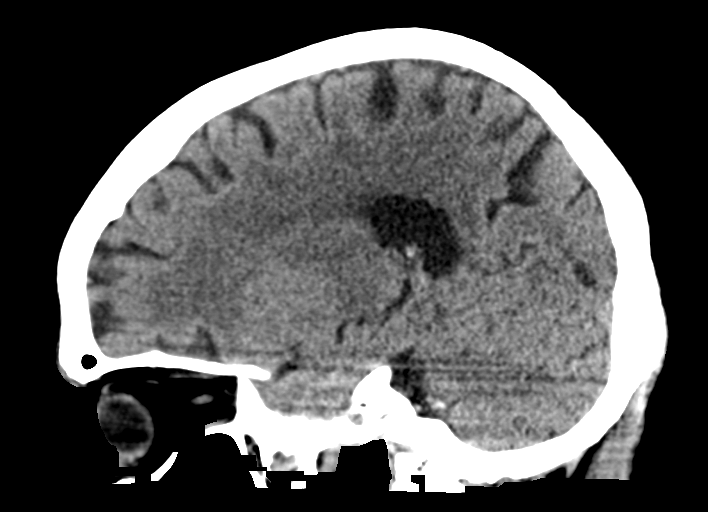
[im 27/54  brain]
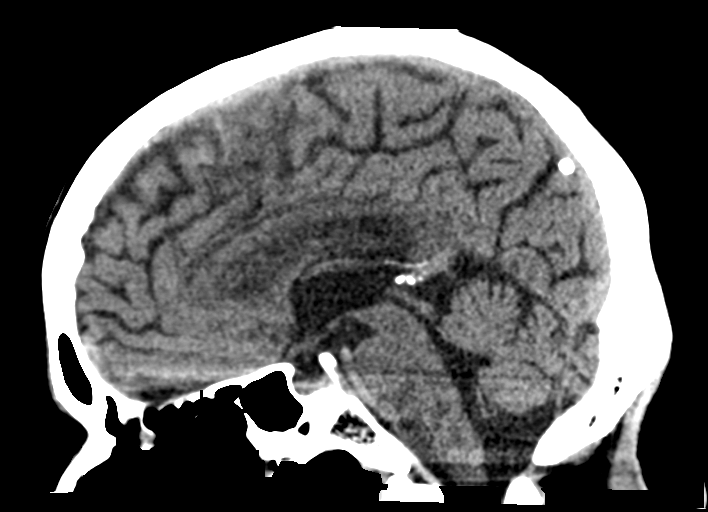
[im 35/54  brain]
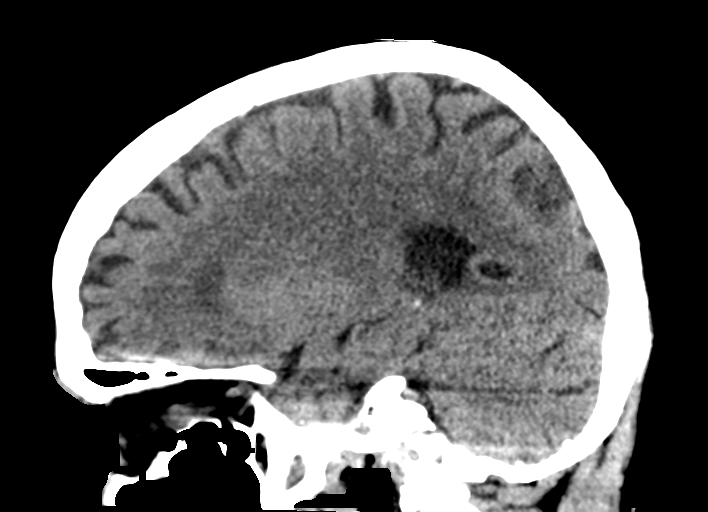

[15 of 46 positions shown; findings below may reference images not displayed]

FINDINGS: Brain: No evidence of acute infarction, hemorrhage, hydrocephalus,
extra-axial collection or mass lesion/mass effect. Mild small vessel
ischemic changes of the white matter and atrophy.

Vascular: Carotid vascular calcification.  No hyperdense vessel

Skull: No acute abnormality

Sinuses/Orbits: Mucosal thickening in the ethmoid and sphenoid
sinuses. No acute orbital abnormality.

Other: None
IMPRESSION: 1. No CT evidence for acute intracranial abnormality.
2. Atrophy and mild small vessel ischemic changes of the white
matter

## 2019-03-16 ENCOUNTER — Other Ambulatory Visit: Payer: Self-pay

## 2019-03-17 ENCOUNTER — Ambulatory Visit: Payer: Self-pay | Admitting: Gynecology

## 2019-03-21 ENCOUNTER — Ambulatory Visit: Payer: Self-pay | Admitting: Gynecology

## 2019-04-03 ENCOUNTER — Other Ambulatory Visit: Payer: Self-pay

## 2019-04-04 ENCOUNTER — Ambulatory Visit: Payer: Self-pay | Admitting: Gynecology

## 2019-04-06 ENCOUNTER — Ambulatory Visit: Payer: Self-pay | Admitting: Gynecology

## 2019-05-23 ENCOUNTER — Other Ambulatory Visit: Payer: Self-pay

## 2019-05-24 ENCOUNTER — Encounter: Payer: Self-pay | Admitting: Gynecology

## 2019-05-24 ENCOUNTER — Ambulatory Visit (INDEPENDENT_AMBULATORY_CARE_PROVIDER_SITE_OTHER): Payer: Medicare HMO | Admitting: Gynecology

## 2019-05-24 VITALS — BP 118/78 | Ht 70.0 in | Wt 160.0 lb

## 2019-05-24 DIAGNOSIS — Z01419 Encounter for gynecological examination (general) (routine) without abnormal findings: Secondary | ICD-10-CM | POA: Diagnosis not present

## 2019-05-24 DIAGNOSIS — N952 Postmenopausal atrophic vaginitis: Secondary | ICD-10-CM | POA: Diagnosis not present

## 2019-05-24 DIAGNOSIS — N3945 Continuous leakage: Secondary | ICD-10-CM

## 2019-05-24 DIAGNOSIS — N814 Uterovaginal prolapse, unspecified: Secondary | ICD-10-CM

## 2019-05-24 NOTE — Progress Notes (Signed)
    Emma [name] 12-12-1932 [id number]        85 y.o.  [id number] new patient presents in follow-up from emergency room evaluation due to vaginal tissue prolapse.  Patient notes over the past year tissue protruding from her vagina particularly with standing.  Causing a lot of pressure and discomfort.  Also having issues with spontaneous urine leakage.  No dysuria urgency low back pain fever or chills.  Has been a number of years since she has had a GYN checkup.  No history of GYN issues or surgeries in the past.  No abnormal Pap smear history.  Past medical history,surgical history, problem list, medications, allergies, family history and social history were all reviewed and documented as reviewed in the EPIC chart.  ROS:  Performed with pertinent positives and negatives included in the history, assessment and plan.   Additional significant findings : None   Exam: Kennon Portela assistant Vitals:   05/24/19 1147  BP: 118/78  Weight: 160 lb (72.6 kg)  Height: 5\' 10"  (1.778 m)   Body mass index is 22.96 kg/m.  General appearance:  Normal affect, orientation and appearance. Skin: Grossly normal HEENT: Without gross lesions.  No cervical or supraclavicular adenopathy. Thyroid normal.  Lungs:  Clear without wheezing, rales or rhonchi Cardiac: RR, without RMG Abdominal:  Soft, nontender, without masses, guarding, rebound, organomegaly or hernia Breasts:  Examined lying and sitting without masses, retractions, discharge or axillary adenopathy. Pelvic:  Ext, BUS, Vagina: With atrophic changes.  Complete vaginal prolapse.  Unable to identify cervix or palpate uterus.  No masses or tenderness on bimanual exam.  Intermittent spontaneous leakage of urine noted after replacement of the large cystocele/vaginal prolapse.  Adnexa: Without masses or tenderness    Anus and perineum: Normal   Rectovaginal: Normal sphincter tone without palpated masses or tenderness.    Assessment/Plan:  85 y.o. [id number]  female with:  1. Large pelvic prolapse.  Unable to identify cervix or uterus on exam.  Prolapse replaces easily.  Discussed options for management to include trial of pessary and surgery.  Given her age I recommended trial of pessary first to see how well she tolerates this.  We fitted her with a 2 and three-quarter ring pessary with support.  She ambulated and did well without discomfort and the pessary remained in place.  We will go ahead and order her the pessary and she will read present for placement. 2. Urinary incontinence.  Has been going on for years.  Wears a pad.  Check baseline urine analysis.  At this point do not think intervention needed as far as further studies such as urodynamics and surgery.  We will see how she does with the pessary and then go from there. 3. Mammography reported 2016.  Recommended follow-up mammogram now.  Breast exam normal today.  Unsure about DEXA, colonoscopy and Pap smear history.  Will allow primary provider decide as far as bone health and colonoscopy screening.  Discussed current Pap smear screening guidelines and based on age we will stop screening.  The patient will follow-up when the pessary becomes available for placement.   Dara Lords MD, 12:36 PM 05/24/2019

## 2019-05-24 NOTE — Patient Instructions (Signed)
Office will contact you when the pessary is available for placement.

## 2019-05-24 NOTE — Addendum Note (Signed)
Addended by: Rushie Goltz on: 05/24/2019 04:11 PM   Modules accepted: Orders

## 2019-05-29 ENCOUNTER — Ambulatory Visit: Payer: Medicare HMO | Admitting: Gynecology

## 2019-05-31 ENCOUNTER — Ambulatory Visit: Payer: Medicare HMO | Admitting: Gynecology

## 2019-06-26 ENCOUNTER — Ambulatory Visit (INDEPENDENT_AMBULATORY_CARE_PROVIDER_SITE_OTHER): Payer: Medicare HMO | Admitting: Gynecology

## 2019-06-26 ENCOUNTER — Other Ambulatory Visit: Payer: Self-pay

## 2019-06-26 ENCOUNTER — Encounter: Payer: Self-pay | Admitting: Gynecology

## 2019-06-26 VITALS — BP 124/80

## 2019-06-26 DIAGNOSIS — N813 Complete uterovaginal prolapse: Secondary | ICD-10-CM | POA: Diagnosis not present

## 2019-06-26 NOTE — Progress Notes (Signed)
    Emma [name] 05-Oct-1932 [id number]        85 y.o.  V6P2244 presents for pessary placement.  Has total procidentia and was fitted with a 2 3/4 ring pessary last month.  Past medical history,surgical history, problem list, medications, allergies, family history and social history were all reviewed and documented in the EPIC chart.  Directed ROS with pertinent positives and negatives documented in the history of present illness/assessment and plan.  Exam: Caryn Bee assistant Vitals:   06/26/19 1408  BP: 124/80   General appearance:  Normal Abdomen soft nontender without mass guarding rebound Pelvic external BUS vagina with atrophic changes.  Complete prolapse with protrusion of her vagina.  Cervix and uterus not identified on inspection or bimanual.  Rectal exam without masses  Ring pessary was placed without difficulty.  Patient ambulated with good support and no discomfort.  Assessment/Plan:  85 y.o. L7N3005 with total procidentia with good response to pessary.  Patient will follow-up in 1 month for recheck.  Sooner if any issues.    Anastasio Auerbach MD, 2:42 PM 06/26/2019

## 2019-06-26 NOTE — Patient Instructions (Signed)
Follow-up in 1 month for pessary recheck.  Call sooner if any issues

## 2019-07-04 ENCOUNTER — Other Ambulatory Visit: Payer: Self-pay

## 2019-07-05 ENCOUNTER — Encounter: Payer: Self-pay | Admitting: Gynecology

## 2019-07-05 ENCOUNTER — Ambulatory Visit (INDEPENDENT_AMBULATORY_CARE_PROVIDER_SITE_OTHER): Payer: Medicare HMO | Admitting: Gynecology

## 2019-07-05 VITALS — BP 124/76

## 2019-07-05 DIAGNOSIS — N813 Complete uterovaginal prolapse: Secondary | ICD-10-CM | POA: Diagnosis not present

## 2019-07-05 DIAGNOSIS — Z4689 Encounter for fitting and adjustment of other specified devices: Secondary | ICD-10-CM

## 2019-07-05 NOTE — Patient Instructions (Signed)
Office will call when pessary is available for placement

## 2019-07-05 NOTE — Progress Notes (Signed)
    Emma [name] May 18, 1932 [id number]        85 y.o.  W4Y6599 presents having recently had a ring pessary size 2-3/4 placed for total procidentia.  She did well noting great relief of her symptoms and discomfort.  She was having a difficult bowel movement and the pessary extruded.  Unfortunately 1 of the aides where she lives discarded it.  Past medical history,surgical history, problem list, medications, allergies, family history and social history were all reviewed and documented in the EPIC chart.  Directed ROS with pertinent positives and negatives documented in the history of present illness/assessment and plan.  Exam: Caryn Bee assistant Vitals:   07/05/19 1426  BP: 124/76   General appearance:  Normal Abdomen soft nontender without masses guarding rebound Pelvic external BUS vagina with atrophic changes.  Total procidentia noted.  Uterus replaced.  Bimanual without masses or tenderness.   #3  Ring pessary was placed and the patient ambulated without difficulty maintaining the pessary with good relief of her symptoms.  Assessment/Plan:  85 y.o. J5T0177 with spontaneous expulsion of her prior pessary.  Next size up pessary #3 was placed and tolerated.  We will go ahead and order this pessary and she will return to have a placed when it is available.    Anastasio Auerbach MD, 2:51 PM 07/05/2019

## 2019-07-21 ENCOUNTER — Other Ambulatory Visit: Payer: Self-pay

## 2019-07-24 ENCOUNTER — Other Ambulatory Visit: Payer: Self-pay

## 2019-07-24 ENCOUNTER — Encounter: Payer: Self-pay | Admitting: Gynecology

## 2019-07-24 ENCOUNTER — Ambulatory Visit (INDEPENDENT_AMBULATORY_CARE_PROVIDER_SITE_OTHER): Payer: Medicare HMO | Admitting: Gynecology

## 2019-07-24 VITALS — BP 122/80

## 2019-07-24 DIAGNOSIS — N814 Uterovaginal prolapse, unspecified: Secondary | ICD-10-CM

## 2019-07-24 DIAGNOSIS — Z4689 Encounter for fitting and adjustment of other specified devices: Secondary | ICD-10-CM | POA: Diagnosis not present

## 2019-07-24 NOTE — Progress Notes (Signed)
    Emma [name] [birth year] [id number]        86 y.o.  L3Y1017 presents for pessary placement.  Had 2 3/4 ring pessary placed which spontaneously extruded with bowel movement.  Was thought to be discarded.  We fitted her for a #3.  She now has found the other pessary and brings it with her.  Past medical history,surgical history, problem list, medications, allergies, family history and social history were all reviewed and documented in the EPIC chart.  Directed ROS with pertinent positives and negatives documented in the history of present illness/assessment and plan.  Exam: Wandra Scot assistant Vitals:   07/24/19 1434  BP: 122/80   General appearance:  Normal Abdomen soft nontender without mass guarding rebound Pelvic external BUS vagina with atrophic changes.  Gross prolapse of the vagina/uterus digitally replaced.  Bimanual without masses or tenderness.  Her 2 3/4 pessary was replaced without difficulty.  Assessment/Plan:  85 y.o. P1W2585 with replacement of her pessary.  We will see how she does with the smaller size that she had before.  If she again spontaneously extrudes this then will go with the size 3.  She will follow-up in a month regardless for pessary check.    Anastasio Auerbach MD, 3:10 PM 07/24/2019

## 2019-07-24 NOTE — Patient Instructions (Signed)
Follow up in 1 month for pessary check

## 2019-09-11 ENCOUNTER — Other Ambulatory Visit: Payer: Self-pay

## 2019-09-12 ENCOUNTER — Encounter: Payer: Self-pay | Admitting: Gynecology

## 2019-09-12 ENCOUNTER — Ambulatory Visit (INDEPENDENT_AMBULATORY_CARE_PROVIDER_SITE_OTHER): Payer: Medicare HMO | Admitting: Gynecology

## 2019-09-12 VITALS — BP 140/82

## 2019-09-12 DIAGNOSIS — Z4689 Encounter for fitting and adjustment of other specified devices: Secondary | ICD-10-CM | POA: Diagnosis not present

## 2019-09-12 DIAGNOSIS — N8189 Other female genital prolapse: Secondary | ICD-10-CM | POA: Diagnosis not present

## 2019-09-12 NOTE — Progress Notes (Signed)
    Emma [name] [birth year] [id number]        85 y.o.  N0N3976 presents for pessary recheck.  History of pelvic prolapse using ring pessary with good results.  No complaints.  No pain no bleeding.  Past medical history,surgical history, problem list, medications, allergies, family history and social history were all reviewed and documented in the EPIC chart.  Directed ROS with pertinent positives and negatives documented in the history of present illness/assessment and plan.  Exam: Caryn Bee assistant Vitals:   09/12/19 1158  BP: 140/82   General appearance:  Normal Abdomen soft nontender without mass guarding rebound Pelvic external BUS vagina with atrophic changes.  Ring pessary removed.  Vaginal mucosa without irritation or erosion.  Bimanual without masses or tenderness.  Ring pessary was cleansed and replaced without difficulty.  Assessment/Plan:  85 y.o. B3A1937 with good response to ring pessary.  Follow-up in 3 months for pessary recheck.  Sooner if any issues.    Anastasio Auerbach MD, 12:07 PM 09/12/2019

## 2019-09-12 NOTE — Patient Instructions (Signed)
Follow-up in 3 months for pessary recheck 

## 2019-10-05 ENCOUNTER — Encounter: Payer: Self-pay | Admitting: Gynecology

## 2019-11-23 ENCOUNTER — Emergency Department (HOSPITAL_COMMUNITY): Payer: Medicare HMO

## 2019-11-23 ENCOUNTER — Other Ambulatory Visit: Payer: Self-pay

## 2019-11-23 ENCOUNTER — Emergency Department (HOSPITAL_COMMUNITY)
Admission: EM | Admit: 2019-11-23 | Discharge: 2019-11-24 | Disposition: A | Discharge status: Home / Self Care | Payer: Medicare HMO | Attending: Emergency Medicine | Admitting: Emergency Medicine

## 2019-11-23 DIAGNOSIS — F0390 Unspecified dementia without behavioral disturbance: Secondary | ICD-10-CM | POA: Diagnosis not present

## 2019-11-23 DIAGNOSIS — M069 Rheumatoid arthritis, unspecified: Secondary | ICD-10-CM | POA: Diagnosis not present

## 2019-11-23 DIAGNOSIS — R0789 Other chest pain: Secondary | ICD-10-CM

## 2019-11-23 DIAGNOSIS — Z79899 Other long term (current) drug therapy: Secondary | ICD-10-CM | POA: Diagnosis not present

## 2019-11-23 DIAGNOSIS — I10 Essential (primary) hypertension: Secondary | ICD-10-CM | POA: Diagnosis not present

## 2019-11-23 LAB — URINALYSIS, ROUTINE W REFLEX MICROSCOPIC
Bilirubin Urine: NEGATIVE
Glucose, UA: NEGATIVE mg/dL
Hgb urine dipstick: NEGATIVE
Ketones, ur: NEGATIVE mg/dL
Leukocytes,Ua: NEGATIVE
Nitrite: NEGATIVE
Protein, ur: NEGATIVE mg/dL
Specific Gravity, Urine: 1.004 — ABNORMAL LOW (ref 1.005–1.030)
pH: 6 (ref 5.0–8.0)

## 2019-11-23 LAB — TROPONIN I (HIGH SENSITIVITY)
Troponin I (High Sensitivity): 9 ng/L (ref <18)
Troponin I (High Sensitivity): 10 ng/L (ref <18)

## 2019-11-23 LAB — HEPATIC FUNCTION PANEL
ALT: 18 U/L (ref 0–44)
AST: 20 U/L (ref 15–41)
Albumin: 3.5 g/dL (ref 3.5–5.0)
Alkaline Phosphatase: 58 U/L (ref 38–126)
Bilirubin, Direct: 0.1 mg/dL (ref 0.0–0.2)
Indirect Bilirubin: 0.6 mg/dL (ref 0.3–0.9)
Total Bilirubin: 0.7 mg/dL (ref 0.3–1.2)
Total Protein: 6.7 g/dL (ref 6.5–8.1)

## 2019-11-23 LAB — BASIC METABOLIC PANEL
Anion gap: 9 (ref 5–15)
BUN: 11 mg/dL (ref 8–23)
CO2: 22 mmol/L (ref 22–32)
Calcium: 9.4 mg/dL (ref 8.9–10.3)
Chloride: 105 mmol/L (ref 98–111)
Creatinine, Ser: 0.69 mg/dL (ref 0.44–1.00)
GFR calc Af Amer: >60 mL/min (ref >60)
GFR calc non Af Amer: >60 mL/min (ref >60)
Glucose, Bld: 134 mg/dL — ABNORMAL HIGH (ref 70–99)
Potassium: 3.4 mmol/L — ABNORMAL LOW (ref 3.5–5.1)
Sodium: 136 mmol/L (ref 135–145)

## 2019-11-23 LAB — CBC
HCT: 35.8 % — ABNORMAL LOW (ref 36.0–46.0)
Hemoglobin: 12.1 g/dL (ref 12.0–15.0)
MCH: 31.4 pg (ref 26.0–34.0)
MCHC: 33.8 g/dL (ref 30.0–36.0)
MCV: 93 fL (ref 80.0–100.0)
Platelets: 199 10*3/uL (ref 150–400)
RBC: 3.85 MIL/uL — ABNORMAL LOW (ref 3.87–5.11)
RDW: 12.6 % (ref 11.5–15.5)
WBC: 8.2 10*3/uL (ref 4.0–10.5)
nRBC: 0 % (ref 0.0–0.2)

## 2019-11-23 LAB — LIPASE, BLOOD: Lipase: 25 U/L (ref 11–51)

## 2019-11-23 MED ORDER — AMLODIPINE BESYLATE 5 MG PO TABS
10.0000 mg | ORAL_TABLET | Freq: Once | ORAL | Status: AC
  Administered 2019-11-23: 10 mg via ORAL
  Filled 2019-11-23: qty 2

## 2019-11-23 MED ORDER — SODIUM CHLORIDE 0.9% FLUSH: 3.0000 mL | Freq: Once | INTRAVENOUS | Status: DC

## 2019-11-23 MED ORDER — AMLODIPINE BESYLATE 10 MG PO TABS: 10.0000 mg | ORAL_TABLET | Freq: Every day | ORAL | 1 refills | Status: AC

## 2019-11-23 MED ORDER — HYDRALAZINE HCL 25 MG PO TABS
25.0000 mg | ORAL_TABLET | Freq: Once | ORAL | Status: AC
  Administered 2019-11-23: 25 mg via ORAL
  Filled 2019-11-23: qty 1

## 2019-11-23 MED ORDER — HYDRALAZINE HCL 25 MG PO TABS: 25.0000 mg | ORAL_TABLET | Freq: Three times a day (TID) | ORAL | 1 refills | Status: AC

## 2019-11-23 NOTE — ED Provider Notes (Signed)
[facility] EMERGENCY DEPARTMENT Provider Note   CSN: [id number] Arrival date & time: 11/23/19  1636     History   Chief Complaint Chief Complaint  Patient presents with  . Chest Pain    HPI Lolita Faulds is a 85 y.o. female.     HPI Patient reports for about a week she has had some off-and-on feelings like there is a area about the size of a quarter under her breast where she feels uncomfortable.  It comes and goes.  She reports sometimes it feels like a "bubble".  She thought maybe it was gas but she is not really getting any relief.  May go away for a while and then come back.  She reports once, a long time ago she had something similar.  She does not remember what her diagnosis was.  She is not experiencing any fever, chills or productive cough.  She denies any pain or swelling in her calves or legs.  No shortness of breath.  No nausea no vomiting. Past Medical History:  Diagnosis Date  . Coronary artery disease   . Dementia (HCC)   . Hypertension   . RA (rheumatoid arthritis) [facility])     Patient Active Problem List   Diagnosis Date Noted  . Acute urinary retention 03/31/2018  . Community acquired pneumonia of left lower lobe of lung   . Nausea vomiting and diarrhea 03/28/2018  . Infiltrate of lower lobe of left lung present on imaging study 03/28/2018  . Dementia (HCC) 03/28/2018  . HTN (hypertension) 03/28/2018  . AKI (acute kidney injury) (HCC) 03/28/2018  . Paranoid (HCC)     Past Surgical History:  Procedure Laterality Date  . stents       OB History    Gravida  3   Para  2   Term      Preterm      AB  1   Living  2     SAB      TAB  1   Ectopic      Multiple      Live Births               Home Medications    Prior to Admission medications   Medication Sig Start Date End Date Taking? Authorizing Provider  benztropine (COGENTIN) 0.5 MG tablet Take 0.5 tablets by mouth 2 (two) times daily. 11/08/17  Yes [provider]  cholecalciferol (VITAMIN D) 1000 units tablet Take 1,000 Units by mouth daily.   Yes [provider]  hydrochlorothiazide (MICROZIDE) 12.5 MG capsule Take 12.5 mg by mouth daily.  11/08/17  Yes [provider]  vitamin A 97948 UNIT capsule Take 10,000 Units by mouth daily.   Yes [provider]  vitamin C (ASCORBIC ACID) 500 MG tablet Take 500 mg by mouth daily.   Yes [provider]  amLODipine (NORVASC) 10 MG tablet Take 1 tablet (10 mg total) by mouth daily. Patient not taking: Reported on 11/23/2019 03/31/18   Rodolph Bong, MD  amLODipine (NORVASC) 10 MG tablet Take 1 tablet (10 mg total) by mouth daily. 11/23/19   Arby Barrette, MD  hydrALAZINE (APRESOLINE) 25 MG tablet Take 1 tablet (25 mg total) by mouth 3 (three) times daily. 11/23/19   Arby Barrette, MD  ondansetron (ZOFRAN) 4 MG tablet Take 4 mg by mouth every 8 (eight) hours as needed for nausea or vomiting.    [provider]  OXYQUINOLONE SULFATE VAGINAL (TRIMO-SAN)  0.025 % GEL Place vaginally.    [provider]  pantoprazole (PROTONIX) 40 MG tablet Take 1 tablet (40 mg total) by mouth daily at 6 (six) AM. Patient not taking: Reported on 11/23/2019 03/31/18   Rodolph Bong, MD  tamsulosin (FLOMAX) 0.4 MG CAPS capsule Take 1 capsule (0.4 mg total) by mouth daily. Patient not taking: Reported on 11/23/2019 04/01/18   Rodolph Bong, MD    Family History Family History  Problem Relation Age of Onset  . Heart disease Mother     Social History Social History   Tobacco Use  . Smoking status: Never Smoker  . Smokeless tobacco: Never Used  Substance Use Topics  . Alcohol use: No  . Drug use: Never     Allergies   Patient has no known allergies.   Review of Systems Review of Systems 10 Systems reviewed and are negative for acute change except as noted in the HPI.  Physical Exam Updated Vital Signs BP (!) 199/83 (BP Location: Left Arm)    Pulse 71   Temp 98 F (36.7 C) (Oral)   Resp (!) 21   Ht 5\' 9"  (1.753 m)   Wt 72.1 kg   SpO2 98%   BMI 23.48 kg/m   Physical Exam Constitutional:      Comments: Patient is alert and nontoxic.  Clinically well in appearance.  No respiratory distress.  HENT:     Head: Normocephalic and atraumatic.  Eyes:     Extraocular Movements: Extraocular movements intact.  Cardiovascular:     Rate and Rhythm: Normal rate and regular rhythm.  Pulmonary:     Effort: Pulmonary effort is normal.     Breath sounds: Normal breath sounds.     Comments: Patient does have some reproducible tenderness at the breast over the chest wall lateral to the sternum.  No crepitus.  No palpable abnormalities of the soft tissues of the breast. Chest:     Chest wall: Tenderness present.  Musculoskeletal: Normal range of motion.        General: No swelling or tenderness.     Right lower leg: No edema.     Left lower leg: No edema.  Skin:    General: Skin is warm and dry.  Neurological:     General: No focal deficit present.     Coordination: Coordination normal.  Psychiatric:        Mood and Affect: Mood normal.      ED Treatments / Results  Labs (all labs ordered are listed, but only abnormal results are displayed) Labs Reviewed  BASIC METABOLIC PANEL - Abnormal; Notable for the following components:      Result Value   Potassium 3.4 (*)    Glucose, Bld 134 (*)    All other components within normal limits  CBC - Abnormal; Notable for the following components:   RBC 3.85 (*)    HCT 35.8 (*)    All other components within normal limits  URINALYSIS, ROUTINE W REFLEX MICROSCOPIC - Abnormal; Notable for the following components:   Color, Urine STRAW (*)    Specific Gravity, Urine 1.004 (*)    All other components within normal limits  LIPASE, BLOOD  HEPATIC FUNCTION PANEL  TROPONIN I (HIGH SENSITIVITY)  TROPONIN I (HIGH SENSITIVITY)    EKG EKG Interpretation  Date/Time:  Thursday November 23 2019 16:44:25 EST Ventricular Rate:  69 PR Interval:    QRS Duration: 102 QT Interval:  408 QTC Calculation: 438 R  Axis:   -34 Text Interpretation: Sinus rhythm Left axis deviation Consider anterior infarct no sig change from previous Confirmed by Charlesetta Shanks [phone]) on 11/23/2019 5:22:09 PM   Radiology Dg Chest 2 View  Result Date: 11/23/2019 CLINICAL DATA:  Chest pain EXAM: CHEST - 2 VIEW COMPARISON:  Chest radiograph dated 03/28/2018. FINDINGS: The heart size and mediastinal contours are within normal limits. Both lungs are clear. Degenerative changes are seen in the spine. IMPRESSION: No active cardiopulmonary disease. Electronically Signed   By: Zerita Boers M.D.   On: 11/23/2019 17:14    Procedures Procedures (including critical care time)  Medications Ordered in ED Medications  sodium chloride flush (NS) 0.9 % injection 3 mL (has no administration in time range)  amLODipine (NORVASC) tablet 10 mg (10 mg Oral Given 11/23/19 2242)  hydrALAZINE (APRESOLINE) tablet 25 mg (25 mg Oral Given 11/23/19 2242)     Initial Impression / Assessment and Plan / ED Course  I have reviewed the triage vital signs and the nursing notes.  Pertinent labs & imaging results that were available during my care of the patient were reviewed by me and considered in my medical decision making (see chart for details).       Patient describes about a weeks worth of a slightly migratory small area of pain that comes and goes in her chest.  He does not have associated symptoms.  EKG does not show any changes from previous.  Troponins are negative.  At this time, do not suspect acute ischemia.  Patient does not describe this as being exertional.  It seems to come and go at rest.  Patient is significantly hypertensive.  She describes having been off of her blood pressure medications.  Will prescribe for her to restart Norvasc and hydralazine.  Patient is cared for at [facility].   Recommendation is for close follow-up with her PCP for recheck.  Patient is discharged in stable condition alert and appropriate without any active chest pain.  Final Clinical Impressions(s) / ED Diagnoses   Final diagnoses:  Essential hypertension  Atypical chest pain    ED Discharge Orders         Ordered    amLODipine (NORVASC) 10 MG tablet  Daily     11/23/19 2339    hydrALAZINE (APRESOLINE) 25 MG tablet  3 times daily     11/23/19 2339           Charlesetta Shanks, MD 11/23/19 2343

## 2019-11-23 NOTE — ED Notes (Signed)
Pt made aware we need a urine sample but is unable to go at this time.

## 2019-11-23 NOTE — ED Notes (Signed)
Pt ambulated to the bathroom independently.

## 2019-11-23 NOTE — ED Triage Notes (Signed)
Came in via ems from [facility] c/o chest pain x 1 week, worst today and unrelieved by NTG. EMS reported ASA given as well.

## 2020-02-13 ENCOUNTER — Other Ambulatory Visit: Payer: Self-pay

## 2020-02-13 ENCOUNTER — Encounter: Payer: Self-pay | Admitting: Obstetrics and Gynecology

## 2020-02-13 ENCOUNTER — Ambulatory Visit (INDEPENDENT_AMBULATORY_CARE_PROVIDER_SITE_OTHER): Payer: Medicare HMO | Admitting: Obstetrics and Gynecology

## 2020-02-13 VITALS — BP 124/84

## 2020-02-13 DIAGNOSIS — N813 Complete uterovaginal prolapse: Secondary | ICD-10-CM | POA: Diagnosis not present

## 2020-02-13 DIAGNOSIS — Z4689 Encounter for fitting and adjustment of other specified devices: Secondary | ICD-10-CM

## 2020-02-13 NOTE — Patient Instructions (Signed)
Please return for Pessary recheck in 3 months

## 2020-02-13 NOTE — Progress Notes (Signed)
   Emma [name]  07-05-1932 [id number]  HPI The patient is a 86 y.o. [id number] who presents today for expulsion of her pessary.  The device was accidentally discarded so for the last few weeks she was using toilet tissue in the vagina to help with the pelvic support.  Looks like she had had a expulsion of a 2 3/4 pessary in the past.  Past medical history,surgical history, problem list, medications, allergies, family history and social history were all reviewed and documented as reviewed in the EPIC chart.  ROS:  No urinary tract symptoms. GYN ROS: no abnormal bleeding, pelvic pain or discharge  Physical Exam  BP 124/84  General: Pleasant elderly female, no acute distress, alert and oriented PELVIC EXAM: VULVA: normal appearing vulva with no masses, tenderness or lesions, VAGINA: complete procidentia, normal appearing vagina with normal color and discharge, no lesions, no irritation or erosion of vaginal epithelium, no attained foreign bodies or paper products, CERVIX: normal appearing cervix without discharge or lesions, no erosions   Size 3 ring pessary with support is placed out difficulty.  The patient is allowed to get up from the exam table and take several steps around the room and then she is returned back to the exam table and examination indicates the pessary had remained in place.  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  She has been having good response to the pessary prior to the expulsion.  She will return in 3 months for a pessary recheck or sooner if there are any issues.  Theresia Majors MD 02/13/20

## 2020-03-13 ENCOUNTER — Ambulatory Visit: Payer: Medicare HMO | Admitting: Obstetrics and Gynecology

## 2020-03-15 ENCOUNTER — Other Ambulatory Visit: Payer: Self-pay

## 2020-03-18 ENCOUNTER — Other Ambulatory Visit: Payer: Self-pay

## 2020-03-18 ENCOUNTER — Ambulatory Visit (INDEPENDENT_AMBULATORY_CARE_PROVIDER_SITE_OTHER): Payer: Medicare HMO | Admitting: Obstetrics & Gynecology

## 2020-03-18 ENCOUNTER — Encounter: Payer: Self-pay | Admitting: Obstetrics & Gynecology

## 2020-03-18 VITALS — BP 124/78

## 2020-03-18 DIAGNOSIS — Z4689 Encounter for fitting and adjustment of other specified devices: Secondary | ICD-10-CM | POA: Diagnosis not present

## 2020-03-18 DIAGNOSIS — N813 Complete uterovaginal prolapse: Secondary | ICD-10-CM

## 2020-03-18 DIAGNOSIS — N3945 Continuous leakage: Secondary | ICD-10-CM | POA: Diagnosis not present

## 2020-03-18 DIAGNOSIS — R350 Frequency of micturition: Secondary | ICD-10-CM | POA: Diagnosis not present

## 2020-03-18 NOTE — Progress Notes (Signed)
    Emma [name] [birth year] [id number]        86 y.o.  [id number] Accompanied by her daughter  RP: Pessary with vaginal discharge and urinary frequency  HPI: Patient had tried Milex ring #4 with support, but that the ring was too small and was falling out repetitively.  Currently using a Milex ring #5 with support, but since last insertion feels some discomfort and her urinary incontinence and frequency has increased.  No other UTI symptoms.  Mild vaginal discharge.  No pelvic pain.  No fever.   OB History  Gravida Para Term Preterm AB Living  3 2     1 2   SAB TAB Ectopic Multiple Live Births    1          # Outcome Date GA Lbr Len/2nd Weight Sex Delivery Anes PTL Lv  3 TAB           2 Para           1 Para             Past medical history,surgical history, problem list, medications, allergies, family history and social history were all reviewed and documented in the EPIC chart.   Directed ROS with pertinent positives and negatives documented in the history of present illness/assessment and plan.  Exam:  Vitals:   03/18/20 1511  BP: 124/78   General appearance:  Normal  Gynecologic exam: Vulva normal.  Milex ring #5 pessary with support laying in horizontal position at lower vagina.  Pessary removed, moderate yellow/green discharge on it.  Pessary thoroughly cleaned.  Vaginal mucosa intact, no bleeding.  Pessary put back in place successfully in a perpendicular position behind the pubic bone.  Pessary fits well, correct size.  Bladder is empty, patient not able to leave a urine sample   Assessment/Plan:  86 y.o. 83   1. Pessary maintenance Milex ring #4 with support was too small, falling out repetitively.  Milex ring #5 with support fits well today with no current complication.  Will continue to use.  2. Frequent urination Probably secondary to pessary position.  No other Sx of UTI.  Patient not able to leave a sample, emptied her bladder too fast upon arriving at our  office.  3. Continuous leakage of urine Will observe if better with repositioning of the pessary.  [id number] MD, 3:17 PM 03/18/2020

## 2020-03-19 ENCOUNTER — Encounter: Payer: Self-pay | Admitting: Obstetrics & Gynecology

## 2020-03-19 NOTE — Patient Instructions (Signed)
1. Pessary maintenance Milex ring #4 with support was too small, falling out repetitively.  Milex ring #5 with support fits well today with no current complication.  Will continue to use.  2. Frequent urination Probably secondary to pessary position.  No other Sx of UTI.  Patient not able to leave a sample, emptied her bladder too fast upon arriving at our office.  3. Continuous leakage of urine Will observe if better with repositioning of the pessary.  Judianne, it was a pleasure seeing you today!

## 2020-03-25 ENCOUNTER — Ambulatory Visit: Payer: Medicare HMO | Admitting: Obstetrics and Gynecology

## 2020-04-30 ENCOUNTER — Other Ambulatory Visit: Payer: Self-pay

## 2020-05-02 ENCOUNTER — Ambulatory Visit: Payer: Medicare HMO | Admitting: Obstetrics and Gynecology

## 2020-05-09 ENCOUNTER — Ambulatory Visit: Payer: Medicaid Other | Admitting: Obstetrics and Gynecology
